# Patient Record
Sex: Female | Born: 1975
Health system: Southern US, Community
[De-identification: ages and names within clinical notes are randomized; demographics above are authoritative.]

## PROBLEM LIST (undated history)

## (undated) DIAGNOSIS — R112 Nausea with vomiting, unspecified: Secondary | ICD-10-CM

## (undated) DIAGNOSIS — E669 Obesity, unspecified: Secondary | ICD-10-CM

## (undated) DIAGNOSIS — Z9889 Other specified postprocedural states: Secondary | ICD-10-CM

## (undated) HISTORY — DX: Obesity, unspecified: E66.9

## (undated) HISTORY — PX: TONSILLECTOMY: SUR1361

## (undated) HISTORY — PX: TYMPANOSTOMY TUBE PLACEMENT: SHX32

---

## 2007-02-17 ENCOUNTER — Emergency Department (HOSPITAL_COMMUNITY): Admission: EM | Admit: 2007-02-17 | Discharge: 2007-02-17 | Payer: Self-pay | Admitting: Emergency Medicine

## 2008-07-03 ENCOUNTER — Encounter: Admission: RE | Admit: 2008-07-03 | Discharge: 2008-07-03 | Payer: Self-pay | Admitting: Obstetrics and Gynecology

## 2008-08-07 ENCOUNTER — Ambulatory Visit: Payer: Self-pay | Admitting: Obstetrics and Gynecology

## 2008-08-08 ENCOUNTER — Encounter (INDEPENDENT_AMBULATORY_CARE_PROVIDER_SITE_OTHER): Payer: Self-pay | Admitting: Obstetrics and Gynecology

## 2008-08-08 ENCOUNTER — Inpatient Hospital Stay (HOSPITAL_COMMUNITY): Admission: AD | Admit: 2008-08-08 | Discharge: 2008-08-13 | Payer: Self-pay | Admitting: Obstetrics and Gynecology

## 2008-08-29 ENCOUNTER — Ambulatory Visit: Admission: RE | Admit: 2008-08-29 | Discharge: 2008-08-29 | Payer: Self-pay | Admitting: Obstetrics and Gynecology

## 2008-09-10 ENCOUNTER — Ambulatory Visit: Admission: RE | Admit: 2008-09-10 | Discharge: 2008-09-10 | Payer: Self-pay | Admitting: Obstetrics and Gynecology

## 2010-12-21 ENCOUNTER — Encounter: Payer: Self-pay | Admitting: Obstetrics and Gynecology

## 2011-04-07 ENCOUNTER — Other Ambulatory Visit: Payer: Self-pay | Admitting: Obstetrics and Gynecology

## 2011-04-13 NOTE — Op Note (Signed)
NAMEJILLAINE, Morgan Hansen             ACCOUNT NO.:  [id number]   MEDICAL RECORD NO.:  [id number]          PATIENT TYPE:  INP   LOCATION:  9372                          FACILITY:  [facility]   PHYSICIAN:  Randye Lobo, M.D.   DATE OF BIRTH:  Jun 07, 1976   DATE OF PROCEDURE:  08/08/2008  DATE OF DISCHARGE:                               OPERATIVE REPORT   PREOPERATIVE DIAGNOSES:  1. Intrauterine gestation at 34 plus 6 weeks.  2. HELLP syndrome with severe thrombocytopenia.  3. A twin gestation with discordant growth and baby B with      intrauterine growth restriction.  4. Gestational diabetes mellitus, diet controlled.   POSTOPERATIVE DIAGNOSES:  1. Intrauterine gestation at 34 plus 6 weeks.  2. HELLP syndrome with severe thrombocytopenia.  3. A twin gestation with discordant growth and baby B with      intrauterine growth restriction.  4. Gestational diabetes mellitus, diet controlled.   PROCEDURE:  Primary low segment transverse cesarean section.   SURGEON:  Conley Simmonds, MD.   ASSISTANT:  Chinita [place], [place].   ANESTHESIA:  General.   IV FLUIDS:  1000 mL Ringer's lactate, transfusion of 1 unit of freezed  platelets (10-pack of platelets).   ESTIMATED BLOOD LOSS:  800 mL.   URINE OUTPUT:  150 mL.   COMPLICATIONS:  None.   DRAINS:  Jackson-Pratt drain in the subcutaneous layer.   INDICATIONS FOR PROCEDURE:  The patient is a 34 year old gravida 1, para  0, Caucasian female, status post in vitro fertilization with a known  twin diamniotic/dichorionic gestation at 48 weeks, who was sent from the  office for elevated blood pressure and 1+ proteinuria.  The patient's  antepartum course had been significant for gestational diabetes  mellitus, which was diet controlled and an ultrasound approximately 2  weeks prior which had shown a discordance of fetal growth of  approximately 15%.  The patient had fetal testing performed on August 07, 2008, at the [facility] and the  nonstress tests were reportedly  reactive.   On the day of admission today, the patient was reporting some headache  and some increased epigastric pain beyond her usual dyspepsia.  She  reported frequent urination of small amounts.   Upon admission, the patient's blood pressure was 147/89.  She had 2+  lower extremity edema.  The fetal heart rate tracings were reactive.  The patient had admission laboratory studies and a 24-hour urine  collection was begun.  The PIH panel was remarkable for a platelet count  of 57,000, which on recount was confirmed to be accurate.  The patient's  SGOT was 38.  On chart review, the patient was noted to have evidence of  thrombocytopenia at her 28-week labs with a platelet count of 120,000.   The patient at the time was diagnosed with possible HELLP syndrome with  significant thrombocytopenia.  A PIH panel was redrawn, and the patient  had an ultrasound simultaneously performed for evaluation of discordant  fetal growth.  Dr. Margot Ables from the Maternal Fetal Medicine Service was  asked to consult on the care of the patient.  The patient's platelet  count did return low at 46,000, and her liver function was noted to  began to rise.  The ultrasound documented baby A to be normally grown at  the [facility], but the baby B had growth measured at less than the  10th percentile.   A decision was made at this time to then proceed with an urgent cesarean  section based on the dropping platelet value.  The patient was typed and  crossed for 2 units of packed red blood cells and a platelet transfusion  of 1 unit of freezed platelets was ordered.  After planning with the  NICU, Anesthesia, and MSM team, the plan was made to proceed with a  cesarean section after risks and benefits were reviewed.   FINDINGS:  Baby A was a viable female delivered at 74 in the frank  breech presentation.  Apgars were 8 at 1 minute and 9 at 5 minutes.  The  amniotic fluid was  noted to be clear.  Baby B was a viable female  delivered at 1825 in the transverse presentation, which was converted to  frank breech.  Apgars again were 8 at 1 minute and 9 at 5 minutes.  The  amniotic fluid was also noted to be clear.  A cord pH was collected for  each of the newborns and were identified by the operating room staff a  separate samples, however, when the reached the laboratory, there were  not clearly identified and could therefore not be officially processed.  The placenta was marked with an umbilical clamp on cord A.  The uterus,  tubes, and ovaries were unremarkable.   SPECIMENS:  The placenta was sent to pathology.   PROCEDURE:  The patient was brought down to the operating room and she  was placed in the supine position with a left lateral tilt.  The abdomen  was sterilely prepped and a Foley catheter was sterilely placed inside  the bladder.  The patient was sterilely draped.   The patient did receive Ancef 1 g IV.  The 1 unit (10-pack of platelets)  was hung, and general anesthesia was induced by the anesthesia team.  A  Pfannenstiel incision was then created sharply with a scalpel and the  incision was carried down to the fascia using the scalpel.  The fascia  was then incised in the midline.  The fascia was extended with a Mayo  scissors.  The rectus muscles were dissected off the fascia superiorly  and inferiorly.  The rectus muscles were sharply divided in the midline.  The parietal peritoneum was elevated with 2 clamps and was entered  ultimately bluntly.  The peritoneal incision was extended cranially and  caudally.  The lower uterine segment was exposed.  The bladder flap was  sharply created.  A transverse lower uterine segment incision was  created with a scalpel.  Membranes were ruptured.  The uterine incision  was extended bluntly.  A hand was inserted through the uterine incision  and the baby A was delivered in the frank breech presentation.  The  legs  were delivered without difficulty.  The arms were swept across the chest  and the vertex was again delivered without difficulty.  The nares and  mouth were suctioned.  The umbilical cord was doubly clamped and cut,  and the newborn was carried over to the awaiting pediatricians in  vigorous condition.   The membranes were then ruptured for baby B.  The baby was converted  from a transverse to frank breech presentation.  Again, the buttocks and  the legs were delivered without difficulty.  The arms were swept across  the chest, and once again the vertex was delivered without delay and  without difficulty.  The nares and mouth were suctioned, and the  umbilical cord was doubly clamped and cut.  Baby B was then carried over  to the awaiting pediatricians in vigorous condition.   The umbilical cord A was marked with the umbilical clamp.  Cord blood  was collected from each of the umbilical cord and was set aside.  The  placenta was then manually extracted and was sent to pathology.   The uterus was exteriorized at this time.  It was wiped, cleaned with a  moistened lap pad.  The uterine incision was closed with a double layer  closure of #1 chromic.  The first was a running-locked layer and a  second was an imbricating layer.  The uterus was then returned to the  peritoneal cavity, which was irrigated and suctioned.  The uterine  incision was reexamined, and toward the midportion of the incision,  there were some oozing edges to the uterine incision.  A series of 3  figure-of-eight sutures of 2-0 chromic were placed and the peritoneal  edges along the uterine incision were cauterized with monopolar cautery.  This provided excellent hemostasis.   Prior to closure to the abdomen, the uterine incision was confirmed to  be hemostatic.   The parietal peritoneum was then closed with a running suture of 2-0  Vicryl.  The rectus muscles were reapproximated in the midline with   interrupted sutures of #1 chromic.  The fascia was closed with a running  suture of 0 Vicryl.  The subcutaneous layer was irrigated and suctioned  and was made hemostatic with monopolar cautery.  There was good  hemostasis at this time.  A Jackson-Pratt drain was then placed in the  subcutaneous layer and the drain was brought through the skin, an  incision was a lateral to the Pfannenstiel incision.  The skin was  closed with staples.  The JP drain was secured to the skin using silk  suture.  A sterile pressure bandage was placed over the incision.   The patient was awakened and extubated and escorted to the recovery in  stable and awake condition.  There were no complications to the  procedure.  All needle, instrument, sponge counts were correct.   The patient will be admitted to adult intensive care for postoperative  magnesium sulfate IV.      Randye Lobo, M.D.  Electronically Signed     BES/MEDQ  D:  08/08/2008  T:  08/09/2008  Job:  [id number]

## 2011-04-13 NOTE — Discharge Summary (Signed)
Morgan Hansen, Morgan Hansen             ACCOUNT NO.:  [id number]   MEDICAL RECORD NO.:  [id number]          PATIENT TYPE:  INP   LOCATION:  9319                          [facility]ACILITY:  [facility]   PHYSICIAN:  Ilda Mori, M.D.   DATE OF BIRTH:  1976-01-22   DATE OF ADMISSION:  08/08/2008  DATE OF DISCHARGE:  08/13/2008                               DISCHARGE SUMMARY   FINAL DIAGNOSES:  Intrauterine gestation at 34-6/7th weeks; hemolysis,  elevated liver enzymes, and low platelets syndrome with severe  thrombocytopenia; twin gestation with discordant growth.  Baby B with  intrauterine growth restriction, diet-controlled gestational diabetes  mellitus, and postoperative anemia.   PROCEDURE:  Primary low-segment transverse cesarean section.   SURGEON:  Randye Lobo, MD.   ASSISTANT:  Jose C. Silva, PA-C.   COMPLICATIONS:  None.   HISTORY OF PRESENT ILLNESS:  This is a 35 year old G1, P0 presents at 44-  6/7th weeks' gestation with a known twin Di-Di gestation.  The patient  is about 34-6/7th weeks when she presented to the office.  She has got  elevated blood pressure with 1+ protein in her urine.  The patient's  antepartum course has been complicated by gestational diabetes mellitus,  which has been diet controlled and an ultrasound 2 weeks prior that  showed discordant of the fetal growth.  The patient had been having  nonstress test at the [facility] that had been reactive.  The  patient was admitted at this time for continued monitoring.  She started  reporting some headaches, epigastric pain, and not urinating frequently.  She did have some edema.  Heart rate tracings were reactive, but a 24-  hour urine collection was begun, lab work and consult with Maternal  Fetal Medicine.  The patient's platelets came back at 57,000 and her 28  weeks' lab work showed her platelets of 120,000, so this is a  significant drop.  The patient was diagnosed with HELLP syndrome with  significant  thrombocytopenia.  The second set of blood work showed her  platelets dropped to 46,000 and liver function began to rise.  An  ultrasound documented baby A to 70th percentile while baby B measured  less than 10th percentile.  At this point, decision was made to proceed  with a cesarean section secondary to these drop in platelet count.  The  patient was typed and crossed and 2 units of packed red blood cells and  platelet transfusion occurred.  The patient had general anesthesia and  was taken to the operating room on August 08, 2008, by Dr. Conley Simmonds where a primary low-segment transverse cesarean section was  performed with the delivery of twin A.  I am just trying to get the  weight.  Twin A was a viable female on a frank breech presentation with  Apgars of 8 and 9.  Twin B was then delivered converted to a frank  breech and was also a female infant with Apgars of 8 and 9.  If I will get  the weights later, I will put them back in the dictation.  Cord pH were  obtained.  The patient was on magnesium sulfate at this time and did  receive Ancef IV prior to the cesarean section.  The delivery went  without complications.  The patient was kept in the AICU on magnesium  sulfate.  The patient's liver function tests were improving by  postoperative day #1.  She did have a Jackson-Pratt drain in place.  Her  hemoglobin did drop to a 7.  During this postoperative course, the  patient was able to be weaned off from magnesium sulfate by  postoperative day #2.  Her blood pressures were returning to within  normal limits.  Pain was well controlled.  By postoperative day #3, the  patient was transferred to the Mother-Baby Unit.  Her hemoglobin never  went below 5, stayed around 7, which was stable.  By postoperative day  #5, the patient was felt ready for discharge.  Her liver function tests  were normal and all her labs were stabilized.  She was sent home on a  regular diet with decreased  activities, told to continue her prenatal  vitamins and iron supplement daily.  She was given Percocet #20 1-4  every 4 hours as needed for pain and told to continue to use ibuprofen  up to 600 mg every 6 hours as needed for pain.  She is to follow up in  our office in 1 week for a blood pressure and incision check.  Instructions and precautions were reviewed with the patient.   LABORATORY DATA ON DISCHARGE:  The patient's hemoglobin was stable with  normal LFTs.  Platelets were above 100,000.      [name], P.A.-C.      Ilda Mori, M.D.  Electronically Signed    MB/MEDQ  D:  09/09/2008  T:  09/10/2008  Job:  [id number]

## 2011-09-01 LAB — CBC
HCT: 20.3 — ABNORMAL LOW
HCT: 30 — ABNORMAL LOW
HCT: 34.3 — ABNORMAL LOW
Hemoglobin: 10.2 — ABNORMAL LOW
Hemoglobin: 11.6 — ABNORMAL LOW
Hemoglobin: 11.9 — ABNORMAL LOW
Hemoglobin: 7 — CL
Hemoglobin: 9.3 — ABNORMAL LOW
MCHC: 33.6
MCHC: 33.9
MCHC: 34
MCHC: 34.5
MCHC: 34.8
MCV: 93.2
MCV: 93.6
MCV: 93.6
MCV: 95.1
Platelets: 116 — ABNORMAL LOW
Platelets: 57 — ABNORMAL LOW
Platelets: ADEQUATE
RBC: 2.15 — ABNORMAL LOW
RBC: 2.17 — ABNORMAL LOW
RBC: 2.86 — ABNORMAL LOW
RBC: 3.21 — ABNORMAL LOW
RBC: 3.68 — ABNORMAL LOW
RBC: 3.76 — ABNORMAL LOW
RDW: 13.6
RDW: 13.9
WBC: 6.8
WBC: 6.8
WBC: 7.9
WBC: 8.8

## 2011-09-01 LAB — CROSSMATCH
ABO/RH(D): O POS
Antibody Screen: NEGATIVE

## 2011-09-01 LAB — COMPREHENSIVE METABOLIC PANEL
ALT: 23
ALT: 26
ALT: 32
ALT: 37 — ABNORMAL HIGH
ALT: 37 — ABNORMAL HIGH
AST: 25
AST: 38 — ABNORMAL HIGH
AST: 45 — ABNORMAL HIGH
AST: 48 — ABNORMAL HIGH
AST: 56 — ABNORMAL HIGH
Albumin: 1.8 — ABNORMAL LOW
Albumin: 1.8 — ABNORMAL LOW
Albumin: 1.8 — ABNORMAL LOW
Albumin: 1.9 — ABNORMAL LOW
Albumin: 2 — ABNORMAL LOW
Alkaline Phosphatase: 136 — ABNORMAL HIGH
Alkaline Phosphatase: 198 — ABNORMAL HIGH
Alkaline Phosphatase: 198 — ABNORMAL HIGH
BUN: 6
BUN: 7
BUN: 8
CO2: 20
CO2: 23
CO2: 23
CO2: 28
Calcium: 7 — ABNORMAL LOW
Calcium: 7.5 — ABNORMAL LOW
Calcium: 8 — ABNORMAL LOW
Calcium: 8 — ABNORMAL LOW
Calcium: 8.2 — ABNORMAL LOW
Calcium: 8.4
Chloride: 104
Chloride: 104
Chloride: 106
Chloride: 106
Chloride: 108
Creatinine, Ser: 0.61
Creatinine, Ser: 0.63
Creatinine, Ser: 0.63
Creatinine, Ser: 0.66
Creatinine, Ser: 0.74
GFR calc Af Amer: >60
GFR calc Af Amer: >60
GFR calc Af Amer: >60
GFR calc Af Amer: >60
GFR calc non Af Amer: >60
GFR calc non Af Amer: >60
GFR calc non Af Amer: >60
GFR calc non Af Amer: >60
Glucose, Bld: 100 — ABNORMAL HIGH
Glucose, Bld: 101 — ABNORMAL HIGH
Glucose, Bld: 129 — ABNORMAL HIGH
Glucose, Bld: 85
Potassium: 3.3 — ABNORMAL LOW
Potassium: 3.6
Potassium: 3.7
Potassium: 4.3
Sodium: 132 — ABNORMAL LOW
Sodium: 135
Sodium: 136
Sodium: 137
Sodium: 138
Sodium: 138
Total Bilirubin: 0.4
Total Bilirubin: 0.4
Total Bilirubin: 0.7
Total Bilirubin: 0.7
Total Protein: 4.2 — ABNORMAL LOW
Total Protein: 4.6 — ABNORMAL LOW
Total Protein: 5 — ABNORMAL LOW
Total Protein: 5 — ABNORMAL LOW

## 2011-09-01 LAB — MAGNESIUM
Magnesium: 3.8 — ABNORMAL HIGH
Magnesium: 5.1 — ABNORMAL HIGH

## 2011-09-01 LAB — APTT: aPTT: 31

## 2011-09-01 LAB — URIC ACID
Uric Acid, Serum: 5
Uric Acid, Serum: 5.3
Uric Acid, Serum: 5.3

## 2011-09-01 LAB — URINE CULTURE
Colony Count: 50000
Special Requests: NEGATIVE

## 2011-09-01 LAB — PROTIME-INR
INR: 1
Prothrombin Time: 12.8

## 2011-09-01 LAB — GLUCOSE, CAPILLARY: Glucose-Capillary: 96

## 2011-09-01 LAB — RPR: RPR Ser Ql: NONREACTIVE

## 2011-09-01 LAB — PREPARE PLATELET PHERESIS

## 2011-09-01 LAB — LACTATE DEHYDROGENASE
LDH: 208
LDH: 210
LDH: 275 — ABNORMAL HIGH
LDH: 277 — ABNORMAL HIGH

## 2012-03-01 LAB — OB RESULTS CONSOLE RPR: RPR: NONREACTIVE

## 2012-03-01 LAB — OB RESULTS CONSOLE HIV ANTIBODY (ROUTINE TESTING)
HIV: NONREACTIVE
HIV: NONREACTIVE

## 2012-03-01 LAB — OB RESULTS CONSOLE ABO/RH

## 2012-03-01 LAB — OB RESULTS CONSOLE RUBELLA ANTIBODY, IGM: Rubella: IMMUNE

## 2012-05-10 ENCOUNTER — Emergency Department (HOSPITAL_COMMUNITY)
Admission: EM | Admit: 2012-05-10 | Discharge: 2012-05-10 | Disposition: A | Discharge status: Home / Self Care | Payer: Self-pay | Source: Home / Self Care

## 2012-07-12 ENCOUNTER — Encounter: Payer: 59 | Attending: Obstetrics and Gynecology | Admitting: *Deleted

## 2012-07-12 VITALS — Ht 63.0 in | Wt 225.1 lb

## 2012-07-12 DIAGNOSIS — O99810 Abnormal glucose complicating pregnancy: Secondary | ICD-10-CM | POA: Insufficient documentation

## 2012-07-12 DIAGNOSIS — O24919 Unspecified diabetes mellitus in pregnancy, unspecified trimester: Secondary | ICD-10-CM

## 2012-07-12 DIAGNOSIS — Z713 Dietary counseling and surveillance: Secondary | ICD-10-CM | POA: Insufficient documentation

## 2012-07-13 ENCOUNTER — Encounter: Payer: Self-pay | Admitting: *Deleted

## 2012-07-13 NOTE — Progress Notes (Signed)
  Patient was seen on 07/12/12 for Gestational Diabetes self-management class at the [facility]. The following learning objectives were met by the patient during this course:   States the definition of Gestational Diabetes  States why dietary management is important in controlling blood glucose  Describes the effects each nutrient has on blood glucose levels  Demonstrates ability to create a balanced meal plan  Demonstrates carbohydrate counting   States when to check blood glucose levels  Demonstrates proper blood glucose monitoring techniques  States the effect of stress and exercise on blood glucose levels  States the importance of limiting caffeine and abstaining from alcohol and smoking  Blood glucose monitor given:  One Touch Ultra Mini Self Monitoring Kit Lot # [id number] X Exp: 4/14 Blood glucose reading: 81 mg/dl  Patient instructed to monitor glucose levels: FBS: 60 - <90 2 hour: <120  *Patient received handouts:  Nutrition Diabetes and Pregnancy  Carbohydrate Counting List  Patient will be seen for follow-up as needed.

## 2012-07-13 NOTE — Patient Instructions (Signed)
Goals:  Check glucose levels per MD as instructed  Follow Gestational Diabetes Diet as instructed  Call for follow-up as needed    

## 2012-09-15 ENCOUNTER — Other Ambulatory Visit: Payer: Self-pay | Admitting: Obstetrics and Gynecology

## 2012-09-15 ENCOUNTER — Encounter (HOSPITAL_COMMUNITY): Payer: Self-pay

## 2012-09-18 ENCOUNTER — Encounter (HOSPITAL_COMMUNITY)
Admission: RE | Admit: 2012-09-18 | Discharge: 2012-09-18 | Disposition: A | Discharge status: Home / Self Care | Payer: 59 | Source: Ambulatory Visit | Attending: Obstetrics and Gynecology | Admitting: Obstetrics and Gynecology

## 2012-09-18 ENCOUNTER — Encounter (HOSPITAL_COMMUNITY): Payer: Self-pay

## 2012-09-18 HISTORY — DX: Other specified postprocedural states: Z98.890

## 2012-09-18 HISTORY — DX: Nausea with vomiting, unspecified: R11.2

## 2012-09-18 LAB — CBC
MCH: 29.8 pg (ref 26.0–34.0)
MCHC: 32.4 g/dL (ref 30.0–36.0)
MCV: 91.8 fL (ref 78.0–100.0)
Platelets: 133 10*3/uL — ABNORMAL LOW (ref 150–400)
RDW: 14.2 % (ref 11.5–15.5)
WBC: 7.1 10*3/uL (ref 4.0–10.5)

## 2012-09-18 LAB — BASIC METABOLIC PANEL
BUN: 10 mg/dL (ref 6–23)
CO2: 22 mEq/L (ref 19–32)
Chloride: 102 mEq/L (ref 96–112)
Creatinine, Ser: 0.65 mg/dL (ref 0.50–1.10)
Potassium: 4 mEq/L (ref 3.5–5.1)

## 2012-09-18 NOTE — Patient Instructions (Addendum)
20 KAYHLA GARNEAU  09/18/2012   Your procedure is scheduled on:  09/19/12  Enter through the Main Entrance of [facility] at 600 AM.  Pick up the phone at the desk and dial [phone].   Call this number if you have problems the morning of surgery: [phone]   Remember:   Do not eat food:After Midnight.  Do not drink clear liquids: After Midnight.  Take these medicines the morning of surgery with A SIP OF WATER: NA   Do not wear jewelry, make-up or nail polish.  Do not wear lotions, powders, or perfumes. You may wear deodorant.  Do not shave 48 hours prior to surgery.  Do not bring valuables to the hospital.  Contacts, dentures or bridgework may not be worn into surgery.  Leave suitcase in the car. After surgery it may be brought to your room.  For patients admitted to the hospital, checkout time is 11:00 AM the day of discharge.   Patients discharged the day of surgery will not be allowed to drive home.  Name and phone number of your driver: NA  Special Instructions: Shower using CHG 2 nights before surgery and the night before surgery.  If you shower the day of surgery use CHG.  Use special wash - you have one bottle of CHG for all showers.  You should use approximately 1/3 of the bottle for each shower.   Please read over the following fact sheets that you were given: MRSA Information

## 2012-09-19 ENCOUNTER — Inpatient Hospital Stay (HOSPITAL_COMMUNITY)
Admission: AD | Admit: 2012-09-19 | Discharge: 2012-09-22 | DRG: 766 | Disposition: A | Discharge status: Home / Self Care | Payer: 59 | Source: Ambulatory Visit | Attending: Obstetrics and Gynecology | Admitting: Obstetrics and Gynecology

## 2012-09-19 ENCOUNTER — Encounter (HOSPITAL_COMMUNITY)
Admission: AD | Disposition: A | Discharge status: Home / Self Care | Payer: Self-pay | Source: Ambulatory Visit | Attending: Obstetrics and Gynecology

## 2012-09-19 ENCOUNTER — Encounter (HOSPITAL_COMMUNITY): Payer: Self-pay | Admitting: Anesthesiology

## 2012-09-19 ENCOUNTER — Inpatient Hospital Stay (HOSPITAL_COMMUNITY): Payer: 59 | Admitting: Anesthesiology

## 2012-09-19 ENCOUNTER — Encounter (HOSPITAL_COMMUNITY): Payer: Self-pay | Admitting: *Deleted

## 2012-09-19 DIAGNOSIS — O34219 Maternal care for unspecified type scar from previous cesarean delivery: Secondary | ICD-10-CM | POA: Diagnosis present

## 2012-09-19 DIAGNOSIS — O09529 Supervision of elderly multigravida, unspecified trimester: Secondary | ICD-10-CM | POA: Diagnosis present

## 2012-09-19 DIAGNOSIS — O99814 Abnormal glucose complicating childbirth: Secondary | ICD-10-CM | POA: Diagnosis present

## 2012-09-19 DIAGNOSIS — Z302 Encounter for sterilization: Secondary | ICD-10-CM

## 2012-09-19 DIAGNOSIS — Z01812 Encounter for preprocedural laboratory examination: Secondary | ICD-10-CM

## 2012-09-19 DIAGNOSIS — O480 Post-term pregnancy: Principal | ICD-10-CM | POA: Diagnosis present

## 2012-09-19 HISTORY — PX: TUBAL LIGATION: SHX77

## 2012-09-19 LAB — PREPARE RBC (CROSSMATCH)

## 2012-09-19 SURGERY — Surgical Case
Anesthesia: Spinal | Site: Abdomen | Wound class: Clean Contaminated

## 2012-09-19 MED ORDER — ONDANSETRON HCL 4 MG PO TABS: 4.0000 mg | ORAL_TABLET | ORAL | Status: DC | PRN

## 2012-09-19 MED ORDER — PROMETHAZINE HCL 25 MG/ML IJ SOLN: 6.2500 mg | INTRAMUSCULAR | Status: DC | PRN

## 2012-09-19 MED ORDER — LACTATED RINGERS IV SOLN
Freq: Once | INTRAVENOUS | Status: AC
  Administered 2012-09-19 (×3): via INTRAVENOUS

## 2012-09-19 MED ORDER — BUPIVACAINE IN DEXTROSE 0.75-8.25 % IT SOLN
INTRATHECAL | Status: DC | PRN
  Administered 2012-09-19: 1.4 mL via INTRATHECAL

## 2012-09-19 MED ORDER — ONDANSETRON HCL 4 MG/2ML IJ SOLN: 4.0000 mg | Freq: Three times a day (TID) | INTRAMUSCULAR | Status: DC | PRN

## 2012-09-19 MED ORDER — CEFAZOLIN SODIUM-DEXTROSE 2-3 GM-% IV SOLR
INTRAVENOUS | Status: AC
  Filled 2012-09-19: qty 50

## 2012-09-19 MED ORDER — MEPERIDINE HCL 25 MG/ML IJ SOLN: 6.2500 mg | INTRAMUSCULAR | Status: DC | PRN

## 2012-09-19 MED ORDER — NALOXONE HCL 0.4 MG/ML IJ SOLN: 0.4000 mg | INTRAMUSCULAR | Status: DC | PRN

## 2012-09-19 MED ORDER — OXYTOCIN 40 UNITS IN LACTATED RINGERS INFUSION - SIMPLE MED
INTRAVENOUS | Status: DC | PRN
  Administered 2012-09-19: 40 [IU] via INTRAVENOUS

## 2012-09-19 MED ORDER — KETOROLAC TROMETHAMINE 30 MG/ML IJ SOLN
30.0000 mg | Freq: Four times a day (QID) | INTRAMUSCULAR | Status: AC | PRN
  Administered 2012-09-19: 30 mg via INTRAVENOUS
  Filled 2012-09-19: qty 1

## 2012-09-19 MED ORDER — MENTHOL 3 MG MT LOZG: 1.0000 | LOZENGE | OROMUCOSAL | Status: DC | PRN

## 2012-09-19 MED ORDER — FLEET ENEMA 7-19 GM/118ML RE ENEM: 1.0000 | ENEMA | Freq: Every day | RECTAL | Status: DC | PRN

## 2012-09-19 MED ORDER — FERROUS SULFATE 325 (65 FE) MG PO TABS
325.0000 mg | ORAL_TABLET | Freq: Two times a day (BID) | ORAL | Status: DC
  Administered 2012-09-20 – 2012-09-22 (×5): 325 mg via ORAL
  Filled 2012-09-19 (×5): qty 1

## 2012-09-19 MED ORDER — MEASLES, MUMPS & RUBELLA VAC ~~LOC~~ INJ: 0.5000 mL | INJECTION | Freq: Once | SUBCUTANEOUS | Status: DC

## 2012-09-19 MED ORDER — SCOPOLAMINE 1 MG/3DAYS TD PT72
MEDICATED_PATCH | TRANSDERMAL | Status: AC
  Administered 2012-09-19: 1.5 mg via TRANSDERMAL
  Filled 2012-09-19: qty 1

## 2012-09-19 MED ORDER — TETANUS-DIPHTH-ACELL PERTUSSIS 5-2.5-18.5 LF-MCG/0.5 IM SUSP: 0.5000 mL | Freq: Once | INTRAMUSCULAR | Status: DC

## 2012-09-19 MED ORDER — CEFAZOLIN SODIUM-DEXTROSE 2-3 GM-% IV SOLR
2.0000 g | INTRAVENOUS | Status: AC
  Administered 2012-09-19: 2 g via INTRAVENOUS

## 2012-09-19 MED ORDER — DIPHENHYDRAMINE HCL 50 MG/ML IJ SOLN: 12.5000 mg | INTRAMUSCULAR | Status: DC | PRN

## 2012-09-19 MED ORDER — ZOLPIDEM TARTRATE 5 MG PO TABS: 5.0000 mg | ORAL_TABLET | Freq: Every evening | ORAL | Status: DC | PRN

## 2012-09-19 MED ORDER — LANOLIN HYDROUS EX OINT: 1.0000 "application " | TOPICAL_OINTMENT | CUTANEOUS | Status: DC | PRN

## 2012-09-19 MED ORDER — NALBUPHINE HCL 10 MG/ML IJ SOLN
5.0000 mg | INTRAMUSCULAR | Status: DC | PRN
  Filled 2012-09-19: qty 1

## 2012-09-19 MED ORDER — METHYLERGONOVINE MALEATE 0.2 MG/ML IJ SOLN: 0.2000 mg | INTRAMUSCULAR | Status: DC | PRN

## 2012-09-19 MED ORDER — IBUPROFEN 600 MG PO TABS
600.0000 mg | ORAL_TABLET | Freq: Four times a day (QID) | ORAL | Status: DC
  Administered 2012-09-20 (×4): 600 mg via ORAL
  Filled 2012-09-19 (×6): qty 1

## 2012-09-19 MED ORDER — KETOROLAC TROMETHAMINE 30 MG/ML IJ SOLN
INTRAMUSCULAR | Status: AC
  Administered 2012-09-19: 30 mg via INTRAMUSCULAR
  Filled 2012-09-19: qty 1

## 2012-09-19 MED ORDER — DIPHENHYDRAMINE HCL 25 MG PO CAPS: 25.0000 mg | ORAL_CAPSULE | Freq: Four times a day (QID) | ORAL | Status: DC | PRN

## 2012-09-19 MED ORDER — SIMETHICONE 80 MG PO CHEW
80.0000 mg | CHEWABLE_TABLET | Freq: Three times a day (TID) | ORAL | Status: DC
  Administered 2012-09-19 – 2012-09-22 (×9): 80 mg via ORAL

## 2012-09-19 MED ORDER — METHYLERGONOVINE MALEATE 0.2 MG PO TABS: 0.2000 mg | ORAL_TABLET | ORAL | Status: DC | PRN

## 2012-09-19 MED ORDER — DIPHENHYDRAMINE HCL 50 MG/ML IJ SOLN: 25.0000 mg | INTRAMUSCULAR | Status: DC | PRN

## 2012-09-19 MED ORDER — ONDANSETRON HCL 4 MG/2ML IJ SOLN
4.0000 mg | INTRAMUSCULAR | Status: DC | PRN
  Administered 2012-09-19: 4 mg via INTRAVENOUS
  Filled 2012-09-19: qty 2

## 2012-09-19 MED ORDER — DIBUCAINE 1 % RE OINT: 1.0000 "application " | TOPICAL_OINTMENT | RECTAL | Status: DC | PRN

## 2012-09-19 MED ORDER — MORPHINE SULFATE (PF) 0.5 MG/ML IJ SOLN
INTRAMUSCULAR | Status: DC | PRN
  Administered 2012-09-19: .15 mg via INTRATHECAL

## 2012-09-19 MED ORDER — SODIUM CHLORIDE 0.9 % IJ SOLN: 3.0000 mL | INTRAMUSCULAR | Status: DC | PRN

## 2012-09-19 MED ORDER — PANTOPRAZOLE SODIUM 40 MG PO TBEC
40.0000 mg | DELAYED_RELEASE_TABLET | Freq: Every day | ORAL | Status: DC
  Administered 2012-09-20: 40 mg via ORAL
  Filled 2012-09-19 (×4): qty 1

## 2012-09-19 MED ORDER — MIDAZOLAM HCL 2 MG/2ML IJ SOLN: 0.5000 mg | Freq: Once | INTRAMUSCULAR | Status: DC | PRN

## 2012-09-19 MED ORDER — METOCLOPRAMIDE HCL 5 MG/ML IJ SOLN
10.0000 mg | Freq: Three times a day (TID) | INTRAMUSCULAR | Status: DC | PRN
  Administered 2012-09-19: 10 mg via INTRAVENOUS
  Filled 2012-09-19 (×2): qty 2

## 2012-09-19 MED ORDER — EPHEDRINE SULFATE 50 MG/ML IJ SOLN
INTRAMUSCULAR | Status: DC | PRN
  Administered 2012-09-19 (×2): 10 mg via INTRAVENOUS

## 2012-09-19 MED ORDER — OXYTOCIN 40 UNITS IN LACTATED RINGERS INFUSION - SIMPLE MED: 62.5000 mL/h | INTRAVENOUS | Status: AC

## 2012-09-19 MED ORDER — ACETAMINOPHEN 10 MG/ML IV SOLN: 1000.0000 mg | Freq: Four times a day (QID) | INTRAVENOUS | Status: AC | PRN

## 2012-09-19 MED ORDER — EPHEDRINE 5 MG/ML INJ
INTRAVENOUS | Status: AC
  Filled 2012-09-19: qty 10

## 2012-09-19 MED ORDER — ONDANSETRON HCL 4 MG/2ML IJ SOLN
INTRAMUSCULAR | Status: AC
  Filled 2012-09-19: qty 2

## 2012-09-19 MED ORDER — DIPHENHYDRAMINE HCL 25 MG PO CAPS
25.0000 mg | ORAL_CAPSULE | ORAL | Status: DC | PRN
  Filled 2012-09-19: qty 1

## 2012-09-19 MED ORDER — LACTATED RINGERS IV SOLN
INTRAVENOUS | Status: DC
  Administered 2012-09-19: 15:00:00 via INTRAVENOUS

## 2012-09-19 MED ORDER — SENNOSIDES-DOCUSATE SODIUM 8.6-50 MG PO TABS
2.0000 | ORAL_TABLET | Freq: Every day | ORAL | Status: DC
  Administered 2012-09-19 – 2012-09-21 (×3): 2 via ORAL

## 2012-09-19 MED ORDER — KETOROLAC TROMETHAMINE 30 MG/ML IJ SOLN
30.0000 mg | Freq: Four times a day (QID) | INTRAMUSCULAR | Status: AC | PRN
  Administered 2012-09-19: 30 mg via INTRAMUSCULAR

## 2012-09-19 MED ORDER — LACTATED RINGERS IV SOLN
INTRAVENOUS | Status: DC | PRN
  Administered 2012-09-19: 08:00:00 via INTRAVENOUS

## 2012-09-19 MED ORDER — SCOPOLAMINE 1 MG/3DAYS TD PT72: 1.0000 | MEDICATED_PATCH | Freq: Once | TRANSDERMAL | Status: DC

## 2012-09-19 MED ORDER — SODIUM CHLORIDE 0.9 % IV SOLN: 1.0000 ug/kg/h | INTRAVENOUS | Status: DC | PRN

## 2012-09-19 MED ORDER — FENTANYL CITRATE 0.05 MG/ML IJ SOLN
INTRAMUSCULAR | Status: AC
  Filled 2012-09-19: qty 2

## 2012-09-19 MED ORDER — ONDANSETRON HCL 4 MG/2ML IJ SOLN
INTRAMUSCULAR | Status: DC | PRN
  Administered 2012-09-19: 4 mg via INTRAVENOUS

## 2012-09-19 MED ORDER — MORPHINE SULFATE 0.5 MG/ML IJ SOLN
INTRAMUSCULAR | Status: AC
  Filled 2012-09-19: qty 10

## 2012-09-19 MED ORDER — WITCH HAZEL-GLYCERIN EX PADS: 1.0000 "application " | MEDICATED_PAD | CUTANEOUS | Status: DC | PRN

## 2012-09-19 MED ORDER — SIMETHICONE 80 MG PO CHEW: 80.0000 mg | CHEWABLE_TABLET | ORAL | Status: DC | PRN

## 2012-09-19 MED ORDER — SCOPOLAMINE 1 MG/3DAYS TD PT72
1.0000 | MEDICATED_PATCH | Freq: Once | TRANSDERMAL | Status: DC
  Administered 2012-09-19: 1.5 mg via TRANSDERMAL

## 2012-09-19 MED ORDER — BISACODYL 10 MG RE SUPP: 10.0000 mg | Freq: Every day | RECTAL | Status: DC | PRN

## 2012-09-19 MED ORDER — PHENYLEPHRINE 40 MCG/ML (10ML) SYRINGE FOR IV PUSH (FOR BLOOD PRESSURE SUPPORT)
PREFILLED_SYRINGE | INTRAVENOUS | Status: AC
  Filled 2012-09-19: qty 5

## 2012-09-19 MED ORDER — OXYCODONE-ACETAMINOPHEN 5-325 MG PO TABS
1.0000 | ORAL_TABLET | ORAL | Status: DC | PRN
  Administered 2012-09-20: 1 via ORAL
  Administered 2012-09-20: 2 via ORAL
  Administered 2012-09-20 (×2): 1 via ORAL
  Administered 2012-09-21 – 2012-09-22 (×7): 2 via ORAL
  Filled 2012-09-19: qty 1
  Filled 2012-09-19 (×6): qty 2
  Filled 2012-09-19: qty 1
  Filled 2012-09-19 (×2): qty 2
  Filled 2012-09-19: qty 1

## 2012-09-19 MED ORDER — OMEPRAZOLE MAGNESIUM 20 MG PO TBEC: 20.0000 mg | DELAYED_RELEASE_TABLET | Freq: Every morning | ORAL | Status: DC

## 2012-09-19 MED ORDER — FENTANYL CITRATE 0.05 MG/ML IJ SOLN: 25.0000 ug | INTRAMUSCULAR | Status: DC | PRN

## 2012-09-19 MED ORDER — OXYTOCIN 10 UNIT/ML IJ SOLN
INTRAMUSCULAR | Status: AC
  Filled 2012-09-19: qty 4

## 2012-09-19 MED ORDER — PRENATAL MULTIVITAMIN CH
1.0000 | ORAL_TABLET | Freq: Every day | ORAL | Status: DC
  Administered 2012-09-20 – 2012-09-22 (×3): 1 via ORAL
  Filled 2012-09-19 (×3): qty 1

## 2012-09-19 MED ORDER — FENTANYL CITRATE 0.05 MG/ML IJ SOLN
INTRAMUSCULAR | Status: DC | PRN
  Administered 2012-09-19: 25 ug via INTRATHECAL

## 2012-09-19 SURGICAL SUPPLY — 31 items
CLIP FILSHIE TUBAL LIGA STRL (Clip) ×5 IMPLANT
CLOTH BEACON ORANGE TIMEOUT ST (SAFETY) ×3 IMPLANT
DRAPE SURG 17X23 STRL (DRAPES) ×3 IMPLANT
DRSG COVADERM 4X10 (GAUZE/BANDAGES/DRESSINGS) ×1 IMPLANT
DURAPREP 26ML APPLICATOR (WOUND CARE) ×3 IMPLANT
ELECT REM PT RETURN 9FT ADLT (ELECTROSURGICAL) ×3
ELECTRODE REM PT RTRN 9FT ADLT (ELECTROSURGICAL) ×2 IMPLANT
EXTRACTOR VACUUM BELL STYLE (SUCTIONS) IMPLANT
GLOVE BIO SURGEON STRL SZ7 (GLOVE) ×6 IMPLANT
GOWN PREVENTION PLUS LG XLONG (DISPOSABLE) ×9 IMPLANT
KIT ABG SYR 3ML LUER SLIP (SYRINGE) IMPLANT
NDL HYPO 25X5/8 SAFETYGLIDE (NEEDLE) IMPLANT
NEEDLE HYPO 25X5/8 SAFETYGLIDE (NEEDLE) IMPLANT
NS IRRIG 1000ML POUR BTL (IV SOLUTION) ×3 IMPLANT
PACK C SECTION WH (CUSTOM PROCEDURE TRAY) ×3 IMPLANT
PAD OB MATERNITY 4.3X12.25 (PERSONAL CARE ITEMS) IMPLANT
RETRACTOR WND ALEXIS 25 LRG (MISCELLANEOUS) ×2 IMPLANT
RTRCTR WOUND ALEXIS 25CM LRG (MISCELLANEOUS) ×3
SLEEVE SCD COMPRESS KNEE MED (MISCELLANEOUS) IMPLANT
STAPLER VISISTAT 35W (STAPLE) IMPLANT
SUT MNCRL 0 VIOLET CTX 36 (SUTURE) ×4 IMPLANT
SUT MONOCRYL 0 CTX 36 (SUTURE) ×2
SUT PDS AB 0 CTX 60 (SUTURE) IMPLANT
SUT PLAIN 2 0 XLH (SUTURE) IMPLANT
SUT VIC AB 0 CT1 27 (SUTURE) ×6
SUT VIC AB 0 CT1 27XBRD ANBCTR (SUTURE) ×4 IMPLANT
SUT VIC AB 2-0 CT1 27 (SUTURE) ×3
SUT VIC AB 2-0 CT1 TAPERPNT 27 (SUTURE) ×2 IMPLANT
TOWEL OR 17X24 6PK STRL BLUE (TOWEL DISPOSABLE) ×6 IMPLANT
TRAY FOLEY CATH 14FR (SET/KITS/TRAYS/PACK) ×3 IMPLANT
WATER STERILE IRR 1000ML POUR (IV SOLUTION) ×3 IMPLANT

## 2012-09-19 NOTE — Addendum Note (Signed)
Addendum  created 09/19/12 1640 by Graciela Husbands, CRNA   Modules edited:Notes Section

## 2012-09-19 NOTE — Progress Notes (Signed)
UR Chart review completed.  

## 2012-09-19 NOTE — Brief Op Note (Signed)
09/19/2012  8:39 AM  PATIENT:  Morgan Hansen  36 y.o. female  PRE-OPERATIVE DIAGNOSIS:  REPEAT  POST-OPERATIVE DIAGNOSIS:  repeat  PROCEDURE:  Procedure(s) (LRB) with comments: CESAREAN SECTION (N/A) BILATERAL TUBAL LIGATION (Bilateral)  SURGEON:  Surgeon(s) and Role:    * Loney Laurence, MD - Primary  ASSISTANTS: none   ANESTHESIA:   spinal  EBL:  Total I/O In: 3500 [I.V.:3500] Out: 1100 [Urine:500; Blood:600]  SPECIMEN:  Source of Specimen:  placenta  DISPOSITION OF SPECIMEN:  PATHOLOGY  COUNTS:  YES  TOURNIQUET:  * No tourniquets in log *  DICTATION: .Note written in EPIC  PLAN OF CARE: Admit to inpatient   PATIENT DISPOSITION:  PACU - hemodynamically stable.   Delay start of Pharmacological VTE agent (>24hrs) due to surgical blood loss or risk of bleeding: not applicable  Complications:  None.    Medications:  Ancef, Pitocin Findings:  Baby female, Apgars 9,10, weight P.   Normal tubes, ovaries and uterus seen.  Technique:  After adequate spinal anesthesia was achieved, the patient was prepped and draped in usual sterile fashion.  A foley catheter was used to drain the bladder.  A pfannanstiel incision was made with the scalpel and carried down to the fascia with the bovie cautery. The fascia was incised in the midline with the scalpel and carried in a transverse curvilinear manner bilaterally.  The fascia was reflected superiorly and inferiorly off the rectus muscles and the muscles split in the midline.  A bowel free portion of the peritoneum was entered bluntly and then extended in a superior and inferior manner with good visualization of the bowel and bladder.  The Alexis instrument was then placed and the vesico-uterine fascia tented up and incised in a transverse curvilinear manner.  A 2 cm transverse incision was made in the upper portion of the lower uterine segment until the amnion was exposed.   The incision was extended transversely in a blunt  manner.  Clear fluid was noted and the baby delivered in the vertex presentation without complication.  The baby was bulb suctioned and the cord was clamped and cut.  The baby was then handed to awaiting Neonatology.  The placenta was then delivered manually and the uterus cleared of all debris.  The uterine incision was then closed with a running lock stitch of 0 monocryl.  An imbricating layer of 0 monocryl was closed as well. Good hemostasis of the uterine incision was achieved and the abdomen was cleared with irrigation.    Prior to the surgery the patient was counseled that the BTL procedure was permanent and all risks, benefits, and alternatives had been discussed.  Each tube in succession was tented up and followed to it's fimbriated end.  One filschie clip was placed on the isthmic portion of each tube.  The clips were confirmed to occlude the entire circumference of each tube.   The peritoneum was closed with a running stitch of 2-0 vicryl.  This incorporated the rectus muscles as a separate layer.  The fascia was then closed with a running stitch of 0 vicryl.  The subcutaneous layer was closed with interrupted  stitches of 3-0 plain gut.  The skin was closed with staples.  The patient tolerated the procedure well and was returned to the recovery room in stable condition.  All counts were correct times three.  Mehak Roskelley A

## 2012-09-19 NOTE — Anesthesia Postprocedure Evaluation (Signed)
  Anesthesia Post-op Note  Patient: Morgan Hansen  Procedure(s) Performed: Procedure(s) (LRB) with comments: CESAREAN SECTION (N/A) BILATERAL TUBAL LIGATION (Bilateral)  Patient Location: Mother/Baby  Anesthesia Type: Spinal  Level of Consciousness: awake, alert  and oriented  Airway and Oxygen Therapy: Patient Spontanous Breathing  Post-op Pain: mild  Post-op Assessment: Post-op Vital signs reviewed, Patient's Cardiovascular Status Stable, Pain level controlled, No headache, No backache, No residual numbness and No residual motor weakness  Post-op Vital Signs: Reviewed and stable  Complications: No apparent anesthesia complications

## 2012-09-19 NOTE — Anesthesia Preprocedure Evaluation (Signed)
Anesthesia Evaluation  Patient identified by MRN, date of birth, ID band Patient awake    Reviewed: Allergy & Precautions, H&P , NPO status , Patient's Chart, lab work & pertinent test results  History of Anesthesia Complications (+) PONV  Airway Mallampati: III      Dental No notable dental hx.    Pulmonary neg pulmonary ROS,  breath sounds clear to auscultation  Pulmonary exam normal       Cardiovascular Exercise Tolerance: Good negative cardio ROS  Rhythm:regular Rate:Normal     Neuro/Psych negative neurological ROS  negative psych ROS   GI/Hepatic negative GI ROS, Neg liver ROS,   Endo/Other  negative endocrine ROSdiabetes, GestationalMorbid obesity  Renal/GU negative Renal ROS  negative genitourinary   Musculoskeletal   Abdominal Normal abdominal exam  (+)   Peds  Hematology negative hematology ROS (+)   Anesthesia Other Findings   Reproductive/Obstetrics (+) Pregnancy                           Anesthesia Physical Anesthesia Plan  ASA: III  Anesthesia Plan: Spinal   Post-op Pain Management:    Induction:   Airway Management Planned:   Additional Equipment:   Intra-op Plan:   Post-operative Plan:   Informed Consent: I have reviewed the patients History and Physical, chart, labs and discussed the procedure including the risks, benefits and alternatives for the proposed anesthesia with the patient or authorized representative who has indicated his/her understanding and acceptance.     Plan Discussed with: Anesthesiologist, CRNA and Surgeon  Anesthesia Plan Comments:         Anesthesia Quick Evaluation

## 2012-09-19 NOTE — Transfer of Care (Signed)
Immediate Anesthesia Transfer of Care Note  Patient: Morgan Hansen  Procedure(s) Performed: Procedure(s) (LRB) with comments: CESAREAN SECTION (N/A) BILATERAL TUBAL LIGATION (Bilateral)  Patient Location: PACU  Anesthesia Type: Spinal  Level of Consciousness: awake, alert  and oriented  Airway & Oxygen Therapy: Patient Spontanous Breathing  Post-op Assessment: Report given to PACU RN and Post -op Vital signs reviewed and stable  Post vital signs: Reviewed and stable  Complications: No apparent anesthesia complications

## 2012-09-19 NOTE — Anesthesia Postprocedure Evaluation (Signed)
  Anesthesia Post-op Note  Patient: Morgan Hansen  Procedure(s) Performed: Procedure(s) (LRB) with comments: CESAREAN SECTION (N/A) BILATERAL TUBAL LIGATION (Bilateral)  Patient Location: PACU  Anesthesia Type: Spinal  Level of Consciousness: awake, alert  and oriented  Airway and Oxygen Therapy: Patient Spontanous Breathing  Post-op Pain: none  Post-op Assessment: Post-op Vital signs reviewed, Patient's Cardiovascular Status Stable, Respiratory Function Stable, Patent Airway, No signs of Nausea or vomiting, Pain level controlled, No headache, No backache and No residual numbness  Post-op Vital Signs: Reviewed and stable  Complications: No apparent anesthesia complications

## 2012-09-19 NOTE — Op Note (Signed)
09/19/2012  8:39 AM  PATIENT:  Morgan Hansen  36 y.o. female  PRE-OPERATIVE DIAGNOSIS:  REPEAT  POST-OPERATIVE DIAGNOSIS:  repeat  PROCEDURE:  Procedure(s) (LRB) with comments: CESAREAN SECTION (N/A) BILATERAL TUBAL LIGATION (Bilateral)  SURGEON:  Surgeon(s) and Role:    * Emmilia Sowder A Anthonio Mizzell, MD - Primary  ASSISTANTS: none   ANESTHESIA:   spinal  EBL:  Total I/O In: 3500 [I.V.:3500] Out: 1100 [Urine:500; Blood:600]  SPECIMEN:  Source of Specimen:  placenta  DISPOSITION OF SPECIMEN:  PATHOLOGY  COUNTS:  YES  TOURNIQUET:  * No tourniquets in log *  DICTATION: .Note written in EPIC  PLAN OF CARE: Admit to inpatient   PATIENT DISPOSITION:  PACU - hemodynamically stable.   Delay start of Pharmacological VTE agent (>24hrs) due to surgical blood loss or risk of bleeding: not applicable  Complications:  None.    Medications:  Ancef, Pitocin Findings:  Baby female, Apgars 9,10, weight P.   Normal tubes, ovaries and uterus seen.  Technique:  After adequate spinal anesthesia was achieved, the patient was prepped and draped in usual sterile fashion.  A foley catheter was used to drain the bladder.  A pfannanstiel incision was made with the scalpel and carried down to the fascia with the bovie cautery. The fascia was incised in the midline with the scalpel and carried in a transverse curvilinear manner bilaterally.  The fascia was reflected superiorly and inferiorly off the rectus muscles and the muscles split in the midline.  A bowel free portion of the peritoneum was entered bluntly and then extended in a superior and inferior manner with good visualization of the bowel and bladder.  The Alexis instrument was then placed and the vesico-uterine fascia tented up and incised in a transverse curvilinear manner.  A 2 cm transverse incision was made in the upper portion of the lower uterine segment until the amnion was exposed.   The incision was extended transversely in a blunt  manner.  Clear fluid was noted and the baby delivered in the vertex presentation without complication.  The baby was bulb suctioned and the cord was clamped and cut.  The baby was then handed to awaiting Neonatology.  The placenta was then delivered manually and the uterus cleared of all debris.  The uterine incision was then closed with a running lock stitch of 0 monocryl.  An imbricating layer of 0 monocryl was closed as well. Good hemostasis of the uterine incision was achieved and the abdomen was cleared with irrigation.    Prior to the surgery the patient was counseled that the BTL procedure was permanent and all risks, benefits, and alternatives had been discussed.  Each tube in succession was tented up and followed to it's fimbriated end.  One filschie clip was placed on the isthmic portion of each tube.  The clips were confirmed to occlude the entire circumference of each tube.   The peritoneum was closed with a running stitch of 2-0 vicryl.  This incorporated the rectus muscles as a separate layer.  The fascia was then closed with a running stitch of 0 vicryl.  The subcutaneous layer was closed with interrupted  stitches of 3-0 plain gut.  The skin was closed with staples.  The patient tolerated the procedure well and was returned to the recovery room in stable condition.  All counts were correct times three.  Roann Merk A    

## 2012-09-19 NOTE — H&P (Signed)
36 y.o.  [redacted]w[redacted]d    G1P0 comes in for a repeat cesarean section at term.  Patient has good fetal movement and no bleeding.  She also desires permanent sterilization.    Past Medical History  Diagnosis Date  . Obesity   . PONV (postoperative nausea and vomiting)   . Diabetes mellitus     GDM diet controlled    Past Surgical History  Procedure Date  . Cesarean section   . Tonsillectomy   . Tympanostomy tube placement     OB History    Grav Para Term Preterm Abortions TAB SAB Ect Mult Living   1        1 2      # Outc Date GA Lbr Len/2nd Wgt Sex Del Anes PTL Lv   1 CUR               History   Social History  . Marital Status: Married    Spouse Name: N/A    Number of Children: N/A  . Years of Education: N/A   Occupational History  . Not on file.   Social History Main Topics  . Smoking status: Never Smoker   . Smokeless tobacco: Not on file  . Alcohol Use: No  . Drug Use: No  . Sexually Active: Not on file   Other Topics Concern  . Not on file   Social History Narrative  . No narrative on file   Shellfish allergy   Prenatal Course: Uncomplicated A1GDM- sugars all normal by diet alone.  AMA- first trimester screen was normal and normal anatomy.  Filed Vitals:   09/19/12 0559  BP: 110/82  Pulse: 68  Temp: 97.9 F (36.6 C)  Resp: 18     Lungs/Cor:  NAD Abdomen:  soft, gravid Ex:  no cords, erythema SVE:  NA FHTs:  present  A/P   For repeat cesarean sectionat term with BTL.  All risks, benefits and alternatives discussed with patient and she desires to proceed.  Melesa Lecy A

## 2012-09-19 NOTE — Anesthesia Procedure Notes (Signed)
Spinal  Patient location during procedure: OR Start time: 09/19/2012 7:55 AM Staffing Anesthesiologist: Mescal Flinchbaugh A. Performed by: anesthesiologist  Preanesthetic Checklist Completed: patient identified, site marked, surgical consent, pre-op evaluation, timeout performed, IV checked, risks and benefits discussed and monitors and equipment checked Spinal Block Patient position: sitting Prep: site prepped and draped and DuraPrep Patient monitoring: heart rate, cardiac monitor, continuous pulse ox and blood pressure Approach: midline Location: L3-4 Injection technique: single-shot Needle Needle type: Sprotte  Needle gauge: 24 G Needle length: 9 cm Needle insertion depth: 5 cm Assessment Sensory level: T4 Additional Notes Patient tolerated procedure well. Adequate sensory level.

## 2012-09-20 ENCOUNTER — Encounter (HOSPITAL_COMMUNITY): Payer: Self-pay | Admitting: Obstetrics and Gynecology

## 2012-09-20 LAB — CBC
HCT: 30.5 % — ABNORMAL LOW (ref 36.0–46.0)
Hemoglobin: 10 g/dL — ABNORMAL LOW (ref 12.0–15.0)
MCV: 92.4 fL (ref 78.0–100.0)
RBC: 3.3 MIL/uL — ABNORMAL LOW (ref 3.87–5.11)
WBC: 5.7 10*3/uL (ref 4.0–10.5)

## 2012-09-20 NOTE — Progress Notes (Signed)
Post Op Day 1 Subjective: no complaints  Objective: Blood pressure 105/61, pulse 62, temperature 98 F (36.7 C), temperature source Axillary, resp. rate 18, height 5\' 3"  (1.6 m), weight 216 lb (97.977 kg), SpO2 95.00%, unknown if currently breastfeeding.  Physical Exam:  General: alert Lochia: appropriate Uterine Fundus: firm Incision: no significant drainage   Basename 09/20/12 0540 09/18/12 1500  HGB 10.0* 12.0  HCT 30.5* 37.0    Assessment/Plan: Doing well.  Discussed discharge tomorrow if she desires.   LOS: 1 day   Teodoro Jeffreys D 09/20/2012, 9:06 AM

## 2012-09-21 LAB — TYPE AND SCREEN

## 2012-09-21 MED ORDER — OXYCODONE-ACETAMINOPHEN 5-325 MG PO TABS: 1.0000 | ORAL_TABLET | ORAL | Status: DC | PRN

## 2012-09-21 NOTE — Progress Notes (Signed)
  Patient is eating, ambulating, voiding.  Pain control is good.  Filed Vitals:   09/20/12 0752 09/20/12 1411 09/20/12 2141 09/21/12 0601  BP: 105/61 110/74 110/70 116/71  Pulse: 62 76 62 91  Temp: 98 F (36.7 C) 98 F (36.7 C) 97.5 F (36.4 C) 97.7 F (36.5 C)  TempSrc: Axillary Oral Oral Oral  Resp: 18 18 18 18   Height:      Weight:      SpO2: 95%       lungs:   clear to auscultation cor:    RRR Abdomen:  soft, appropriate tenderness, incisions intact and without erythema or exudate ex:    no cords   Lab Results  Component Value Date   WBC 5.7 09/20/2012   HGB 10.0* 09/20/2012   HCT 30.5* 09/20/2012   MCV 92.4 09/20/2012   PLT 89* 09/20/2012    --/--/O POS (10/21 1500)/RI  A/P    Post operative day 2.  Routine post op and postpartum care.  Expect d/c today.  Percocet for pain control.

## 2012-09-21 NOTE — Discharge Summary (Signed)
Obstetric Discharge Summary Reason for Admission: cesarean section Prenatal Procedures: none Intrapartum Procedures: cesarean section, BTL Postpartum Procedures: none Complications-Operative and Postpartum: none Hemoglobin  Date Value Range Status  09/20/2012 10.0* 12.0 - 15.0 g/dL Final     HCT  Date Value Range Status  09/20/2012 30.5* 36.0 - 46.0 % Final   Hospital Course: Pt admitted for repeat C/S at term (post due date) secondary no labor for TOL.  Underwent uncomplicated repeat C/S and BTL.  Postoperative course was normal and pt was d/ced on POD 2.  Discharge Diagnoses: Term Pregnancy-delivered  Discharge Information: Date: 09/21/2012 Activity: pelvic rest Diet: routine Medications: Percocet Condition: stable Instructions: refer to practice specific booklet Discharge to: home Follow-up Information    Follow up with Kirby Argueta A, MD. Schedule an appointment as soon as possible for a visit in 2 weeks. (come to have staples out tomorrow)    Contact information:   [address] [place] [address] [phone]          Newborn Data: Live born female  Birth Weight: 6 lb 11.4 oz (3045 g) APGAR: 9, 10  Home with mother.  Morgan Hansen A 09/21/2012, 8:23 AM

## 2012-09-22 MED ORDER — OXYCODONE-ACETAMINOPHEN 5-325 MG PO TABS: 1.0000 | ORAL_TABLET | ORAL | Status: DC | PRN

## 2012-09-25 NOTE — Progress Notes (Signed)
Post discharge chart review completed.  

## 2014-02-22 ENCOUNTER — Other Ambulatory Visit: Payer: Self-pay | Admitting: Obstetrics and Gynecology

## 2014-04-05 ENCOUNTER — Emergency Department (INDEPENDENT_AMBULATORY_CARE_PROVIDER_SITE_OTHER): Payer: 59

## 2014-04-05 ENCOUNTER — Emergency Department (HOSPITAL_COMMUNITY)
Admission: EM | Admit: 2014-04-05 | Discharge: 2014-04-05 | Disposition: A | Discharge status: Home / Self Care | Payer: 59 | Source: Home / Self Care | Attending: Emergency Medicine | Admitting: Emergency Medicine

## 2014-04-05 ENCOUNTER — Encounter (HOSPITAL_COMMUNITY): Payer: Self-pay | Admitting: Emergency Medicine

## 2014-04-05 DIAGNOSIS — S52599A Other fractures of lower end of unspecified radius, initial encounter for closed fracture: Secondary | ICD-10-CM

## 2014-04-05 DIAGNOSIS — W19XXXA Unspecified fall, initial encounter: Secondary | ICD-10-CM

## 2014-04-05 DIAGNOSIS — S52509A Unspecified fracture of the lower end of unspecified radius, initial encounter for closed fracture: Secondary | ICD-10-CM

## 2014-04-05 DIAGNOSIS — Y93E9 Activity, other interior property and clothing maintenance: Secondary | ICD-10-CM

## 2014-04-05 NOTE — ED Notes (Signed)
Pt c/o left arm inj onset 1430 Reports she slipped on a toy and fell on right arm/forearm Pain increases w/activity; 5/10 Sx also include swelling Alert w/no signs of acute distress.

## 2014-04-05 NOTE — Discharge Instructions (Signed)
Cast or Splint Care Casts and splints support injured limbs and keep bones from moving while they heal. It is important to care for your cast or splint at home.  HOME CARE INSTRUCTIONS  Keep the cast or splint uncovered during the drying period. It can take 24 to 48 hours to dry if it is made of plaster. A fiberglass cast will dry in less than 1 hour.  Do not rest the cast on anything harder than a pillow for the first 24 hours.  Do not put weight on your injured limb or apply pressure to the cast until your health care provider gives you permission.  Keep the cast or splint dry. Wet casts or splints can lose their shape and may not support the limb as well. A wet cast that has lost its shape can also create harmful pressure on your skin when it dries. Also, wet skin can become infected.  Cover the cast or splint with a plastic bag when bathing or when out in the rain or snow. If the cast is on the trunk of the body, take sponge baths until the cast is removed.  If your cast does become wet, dry it with a towel or a blow dryer on the cool setting only.  Keep your cast or splint clean. Soiled casts may be wiped with a moistened cloth.  Do not place any hard or soft foreign objects under your cast or splint, such as cotton, toilet paper, lotion, or powder.  Do not try to scratch the skin under the cast with any object. The object could get stuck inside the cast. Also, scratching could lead to an infection. If itching is a problem, use a blow dryer on a cool setting to relieve discomfort.  Do not trim or cut your cast or remove padding from inside of it.  Exercise all joints next to the injury that are not immobilized by the cast or splint. For example, if you have a long leg cast, exercise the hip joint and toes. If you have an arm cast or splint, exercise the shoulder, elbow, thumb, and fingers.  Elevate your injured arm or leg on 1 or 2 pillows for the first 1 to 3 days to decrease  swelling and pain.It is best if you can comfortably elevate your cast so it is higher than your heart. SEEK MEDICAL CARE IF:   Your cast or splint cracks.  Your cast or splint is too tight or too loose.  You have unbearable itching inside the cast.  Your cast becomes wet or develops a soft spot or area.  You have a bad smell coming from inside your cast.  You get an object stuck under your cast.  Your skin around the cast becomes red or raw.  You have new pain or worsening pain after the cast has been applied. SEEK IMMEDIATE MEDICAL CARE IF:   You have fluid leaking through the cast.  You are unable to move your fingers or toes.  You have discolored (blue or white), cool, painful, or very swollen fingers or toes beyond the cast.  You have tingling or numbness around the injured area.  You have severe pain or pressure under the cast.  You have any difficulty with your breathing or have shortness of breath.  You have chest pain. Document Released: 11/12/2000 Document Revised: 09/05/2013 Document Reviewed: 05/24/2013 [facility] Patient Information 2014 [place].  Radial Fracture You have a broken bone (fracture) of the forearm. This is the  part of your arm between the elbow and your wrist. Your forearm is made up of two bones. These are the radius and ulna. Your fracture is in the radial shaft. This is the bone in your forearm located on the thumb side. A cast or splint is used to protect and keep your injured bone from moving. The cast or splint will be on generally for about 5 to 6 weeks, with individual variations. HOME CARE INSTRUCTIONS   Keep the injured part elevated while sitting or lying down. Keep the injury above the level of your heart (the center of the chest). This will decrease swelling and pain.  Apply ice to the injury for 15-20 minutes, 03-04 times per day while awake, for 2 days. Put the ice in a plastic bag and place a towel between the bag of ice and  your cast or splint.  Move your fingers to avoid stiffness and minimize swelling.  If you have a plaster or fiberglass cast:  Do not try to scratch the skin under the cast using sharp or pointed objects.  Check the skin around the cast every day. You may put lotion on any red or sore areas.  Keep your cast dry and clean.  If you have a plaster splint:  Wear the splint as directed.  You may loosen the elastic around the splint if your fingers become numb, tingle, or turn cold or blue.  Do not put pressure on any part of your cast or splint. It may break. Rest your cast only on a pillow for the first 24 hours until it is fully hardened.  Your cast or splint can be protected during bathing with a plastic bag. Do not lower the cast or splint into water.  Only take over-the-counter or prescription medicines for pain, discomfort, or fever as directed by your caregiver. SEEK IMMEDIATE MEDICAL CARE IF:   Your cast gets damaged or breaks.  You have more severe pain or swelling than you did before getting the cast.  You have severe pain when stretching your fingers.  There is a bad smell, new stains and/or pus-like (purulent) drainage coming from under the cast.  Your fingers or hand turn pale or blue and become cold or your loose feeling. Document Released: 04/28/2006 Document Revised: 02/07/2012 Document Reviewed: 07/25/2006 [facility] Patient Information 2014 [place].

## 2014-04-05 NOTE — ED Provider Notes (Signed)
CSN: [id number]     Arrival date & time 04/05/14  1636 History   First MD Initiated Contact with Patient 04/05/14 1736     Chief Complaint  Patient presents with  . Arm Injury   (Consider location/radiation/quality/duration/timing/severity/associated sxs/prior Treatment) HPI Comments: 38 year old female presents complaining of left wrist pain. Earlier today she was cleaning her child's room when she slipped and had a fall on outstretched hand. She had immediate severe pain in the wrist. She has also had some swelling, and some tingling in the hand distal to the wrist. Pain is constant and is worse with any movement. She has no other injury. No previous history of wrist injuries.  Patient is a 38 y.o. female presenting with arm injury.  Arm Injury   Past Medical History  Diagnosis Date  . Obesity   . PONV (postoperative nausea and vomiting)   . Diabetes mellitus     GDM diet controlled   Past Surgical History  Procedure Laterality Date  . Cesarean section    . Tonsillectomy    . Tympanostomy tube placement    . Cesarean section  09/19/2012    Procedure: CESAREAN SECTION;  Surgeon: Daria Pastures, MD;  Location: [facility];  Service: Obstetrics;  Laterality: N/A;  . Tubal ligation  09/19/2012    Procedure: BILATERAL TUBAL LIGATION;  Surgeon: Daria Pastures, MD;  Location: [facility];  Service: Obstetrics;  Laterality: Bilateral;   Family History  Problem Relation Age of Onset  . Asthma Other   . Cancer Other    History  Substance Use Topics  . Smoking status: Never Smoker   . Smokeless tobacco: Not on file  . Alcohol Use: No   OB History   Grav Para Term Preterm Abortions TAB SAB Ect Mult Living   1 1 1      1 3      Review of Systems  Musculoskeletal: Positive for arthralgias.       See history of present illness  Neurological: Negative for numbness.  All other systems reviewed and are negative.   Allergies  Shellfish allergy  Home Medications   Prior to  Admission medications   Medication Sig Start Date End Date Taking? Authorizing Provider  omeprazole (PRILOSEC OTC) 20 MG tablet Take 20 mg by mouth daily as needed.    Historical Provider, MD  oxyCODONE-acetaminophen (PERCOCET/ROXICET) 5-325 MG per tablet Take 1-2 tablets by mouth every 4 (four) hours as needed (moderate - severe pain). 09/21/12   Daria Pastures, MD  oxyCODONE-acetaminophen (ROXICET) 5-325 MG per tablet Take 1 tablet by mouth every 4 (four) hours as needed for pain. 09/22/12   Olga Millers, MD  Prenatal Vit-Fe Fumarate-FA (PRENATAL MULTIVITAMIN) TABS Take 1 tablet by mouth daily.    Historical Provider, MD   BP 114/61  Pulse 77  Temp(Src) 98.4 F (36.9 C) (Oral)  Resp 18  SpO2 98%  LMP 03/29/2014 Physical Exam  Nursing note and vitals reviewed. Constitutional: She is oriented to person, place, and time. Vital signs are normal. She appears well-developed and well-nourished. No distress.  HENT:  Head: Normocephalic and atraumatic.  Pulmonary/Chest: Effort normal. No respiratory distress.  Musculoskeletal:       Left wrist: She exhibits tenderness (severe, diffuse).       Left hand: She exhibits normal capillary refill. Normal sensation noted.  Neurological: She is alert and oriented to person, place, and time. She has normal strength. No sensory deficit. Coordination normal.  Skin: Skin is warm  and dry. No rash noted. She is not diaphoretic.  Psychiatric: She has a normal mood and affect. Judgment normal.    ED Course  Procedures (including critical care time) Labs Review Labs Reviewed - No data to display  Imaging Review Dg Wrist Complete Left  04/05/2014   CLINICAL DATA:  Status post fall.  Left wrist pain.  EXAM: LEFT WRIST - COMPLETE 3+ VIEW  COMPARISON:  None.  FINDINGS: There is a nondisplaced fracture of the distal radius at the metaphysis. No disruption at articular surface is identified. No other fracture is seen. Soft tissue swelling about the wrist  is noted.  IMPRESSION: Nondisplaced metaphysis seal fracture distal left radius.   Electronically Signed   By: Inge Rise M.D.   On: 04/05/2014 17:37     MDM   1. Distal radial fracture    Placed in a sugar tong splint. Patient declines any pain medications. She will call or follow-Dr. Kizzie Fantasia Monday for followup appointment.  Keep elevated, apply ice packs.    Liam Graham, PA-C 04/05/14 [phone]

## 2014-04-05 NOTE — Progress Notes (Signed)
Orthopedic Tech Progress Note Patient Details:  Morgan Hansen 09-05-1976 [id number]  Ortho Devices Type of Ortho Device: Ace wrap;Arm sling;Sugartong splint Ortho Device/Splint Location: LUE Ortho Device/Splint Interventions: Ordered;Application   Braulio Bosch 04/05/2014, 6:23 PM

## 2014-04-06 NOTE — ED Provider Notes (Signed)
Medical screening examination/treatment/procedure(s) were performed by non-physician practitioner and as supervising physician I was immediately available for consultation/collaboration.  Philipp Deputy, M.D.  Harden Mo, MD 04/06/14 [phone]

## 2014-09-30 ENCOUNTER — Encounter (HOSPITAL_COMMUNITY): Payer: Self-pay | Admitting: Emergency Medicine

## 2014-12-27 ENCOUNTER — Other Ambulatory Visit: Payer: Self-pay | Admitting: Obstetrics and Gynecology

## 2015-12-10 DIAGNOSIS — L718 Other rosacea: Secondary | ICD-10-CM | POA: Diagnosis not present

## 2015-12-10 DIAGNOSIS — Z973 Presence of spectacles and contact lenses: Secondary | ICD-10-CM | POA: Diagnosis not present

## 2015-12-10 DIAGNOSIS — H5213 Myopia, bilateral: Secondary | ICD-10-CM | POA: Diagnosis not present

## 2016-01-02 MED FILL — valACYclovir HCL 1 GM TABS: 1 | 30 days supply | Qty: 30 | Fill #2

## 2016-02-12 MED FILL — valACYclovir HCL 1 GM TABS: 1 | 30 days supply | Qty: 30 | Fill #3

## 2016-04-14 DIAGNOSIS — Z0000 Encounter for general adult medical examination without abnormal findings: Secondary | ICD-10-CM | POA: Diagnosis not present

## 2016-04-14 MED FILL — valACYclovir HCL 1 GM TABS: 1 | 30 days supply | Qty: 30 | Fill #4

## 2016-04-21 DIAGNOSIS — E6609 Other obesity due to excess calories: Secondary | ICD-10-CM | POA: Diagnosis not present

## 2016-04-21 DIAGNOSIS — Z0000 Encounter for general adult medical examination without abnormal findings: Secondary | ICD-10-CM | POA: Diagnosis not present

## 2016-05-30 ENCOUNTER — Ambulatory Visit (HOSPITAL_COMMUNITY): Payer: 59

## 2016-05-30 ENCOUNTER — Ambulatory Visit (HOSPITAL_COMMUNITY)
Admission: EM | Admit: 2016-05-30 | Discharge: 2016-05-30 | Disposition: A | Discharge status: Home / Self Care | Payer: 59 | Attending: Emergency Medicine | Admitting: Emergency Medicine

## 2016-05-30 ENCOUNTER — Encounter (HOSPITAL_COMMUNITY): Payer: Self-pay | Admitting: Emergency Medicine

## 2016-05-30 DIAGNOSIS — Y92832 Beach as the place of occurrence of the external cause: Secondary | ICD-10-CM | POA: Diagnosis not present

## 2016-05-30 DIAGNOSIS — S86812A Strain of other muscle(s) and tendon(s) at lower leg level, left leg, initial encounter: Secondary | ICD-10-CM | POA: Diagnosis not present

## 2016-05-30 DIAGNOSIS — S86112A Strain of other muscle(s) and tendon(s) of posterior muscle group at lower leg level, left leg, initial encounter: Secondary | ICD-10-CM | POA: Diagnosis not present

## 2016-05-30 DIAGNOSIS — W1692XA Jumping or diving into unspecified water causing other injury, initial encounter: Secondary | ICD-10-CM | POA: Diagnosis not present

## 2016-05-30 DIAGNOSIS — M79605 Pain in left leg: Secondary | ICD-10-CM | POA: Diagnosis not present

## 2016-05-30 NOTE — Discharge Instructions (Signed)
Medial Head Gastrocnemius Tear With Rehab Medial head gastrocnemius tear, also called tennis leg, is a tear (strain) in a muscle or tendon of the inner portion (medial head) of one of the calf muscles (gastrocnemius). The inner portion of the calf muscle attaches to the thigh bone (femur) and is responsible for bending the knee and straightening the foot (standing "on tiptoe"). Strains are classified into three categories. Grade 1 strains cause pain, but the tendon is not lengthened. Grade 2 strains include a lengthened ligament, due to the ligament being stretched or partially ruptured. With grade 2 strains there is still function, although function may be decreased. Grade 3 strains involve a complete tear of the tendon or muscle, and function is usually impaired. SYMPTOMS   Sudden "pop" or tear felt at the time of injury.  Pain, tenderness, swelling, warmth, or redness over the middle inner calf.  Pain and weakness with ankle motion, especially flexing the ankle against resistance, as well as pain with lifting up the foot (extending the ankle).  Bruising (contusion) of the calf, heel, and sometimes the foot within 48 hours of injury.  Muscle spasm in the calf. CAUSES  Muscle and ligament strains occur when a force is placed on the muscle or ligament that is greater than it can handle. Common causes of injury include:  Direct hit (trauma) to the calf.  Sudden forceful pushing off or landing on the foot (jumping, landing, serving a tennis ball, lunging). RISK INCREASES WITH:  Sports that require sudden, explosive calf muscle contraction, such as those involving jumping (basketball), hill running, quick starts (running), or lunging (racquetball, tennis).  Contact sports (football, soccer, hockey).  Poor strength and flexibility.  Previous lower limb injury. PREVENTION  Warm up and stretch properly before activity.  Allow for adequate recovery between workouts.  Maintain physical  fitness:  Strength, flexibility, and endurance.  Cardiovascular fitness.  Learn and use proper exercise technique.  Complete rehabilitation after lower limb injury, before returning to competition or practice. PROGNOSIS  If treated properly, tennis leg usually heals within 6 weeks of nonsurgical treatment.  RELATED COMPLICATIONS   Longer healing time, if not properly treated or if not given enough time to heal.  Recurring symptoms and injury, if activity is resumed too soon, with overuse, with a direct blow, or with poor technique.  If untreated, may progress to a complete tear (rare) or other injury, due to limping and favoring of the injured leg.  Persistent limping, due to scarring and shortening of the calf muscles, as a result of inadequate rehabilitation.  Prolonged disability. TREATMENT  Treatment first involves the use of ice and medication to help reduce pain and inflammation. The use of strengthening and stretching exercises may help reduce pain with activity. These exercises may be performed at home or with a therapist. For severe injuries, referral to a therapist may be needed for further evaluation and treatment. Your caregiver may advise that you wear a brace to help healing. Sometimes, crutches are needed until you can walk without limping. Rarely, surgery is needed.  MEDICATION   If pain medicine is needed, nonsteroidal anti-inflammatory medicines (aspirin and ibuprofen), or other minor pain relievers (acetaminophen), are often advised.  Do not take pain medicine for 7 days before surgery.  Prescription pain relievers may be given, if your caregiver thinks they are needed. Use only as directed and only as much as you need. HEAT AND COLD  Cold treatment (icing) should be applied for 10 to 15 minutes every  2 to 3 hours for inflammation and pain, and immediately after activity that aggravates your symptoms. Use ice packs or an ice massage.  Heat treatment may be used  before performing stretching and strengthening activities prescribed by your caregiver, physical therapist, or athletic trainer. Use a heat pack or a warm water soak. SEEK MEDICAL CARE IF:   Symptoms get worse or do not improve in 2 weeks, despite treatment.  Numbness or tingling develops.  New, unexplained symptoms develop. (Drugs used in treatment may produce side effects.) EXERCISES  RANGE OF MOTION (ROM) AND STRETCHING EXERCISES - Medial Head Gastrocnemius Tear (Tennis Leg) These exercises may help you when beginning to rehabilitate your injury. Your symptoms may resolve with or without further involvement from your physician, physical therapist, or athletic trainer. While completing these exercises, remember:   Restoring tissue flexibility helps normal motion to return to the joints. This allows healthier, less painful movement and activity.  An effective stretch should be held for at least 30 seconds.  A stretch should never be painful. You should only feel a gentle lengthening or release in the stretched tissue. STRETCH - Gastrocsoleus  Sit with your right / left leg extended. Holding onto both ends of a belt or towel, loop it around the ball of your foot.  Keeping your right / left ankle and foot relaxed and your knee straight, pull your foot and ankle toward you using the belt.  You should feel a gentle stretch behind your calf or knee. Hold this position for __________ seconds. Repeat __________ times. Complete this stretch __________ times per day.  RANGE OF MOTION - Ankle Dorsiflexion, Active Assisted   Remove your shoes and sit on a chair, preferably not on a carpeted surface.  Place your right / left foot directly under the knee. Extend your opposite leg for support.  Keeping your heel down, slide your right / left foot back toward the chair, until you feel a stretch at your ankle or calf. If you do not feel a stretch, slide your bottom forward to the edge of the chair,  while still keeping your heel down.  Hold this stretch for __________ seconds. Repeat __________ times. Complete this stretch __________ times per day.  STRETCH - Gastroc, Standing   Place your hands on a wall.  Extend your right / left leg behind you, keeping the front knee somewhat bent.  Slightly point your toes inward on your back foot.  Keeping your right / left heel on the floor and your knee straight, shift your weight toward the wall, not allowing your back to arch.  You should feel a gentle stretch in the right / left calf. Hold this position for __________ seconds. Repeat __________ times. Complete this stretch __________ times per day. STRETCH - Soleus, Standing   Place your hands on a wall.  Extend your right / left leg behind you, keeping the other knee somewhat bent.  Point your toes of your back foot slightly inward.  Keep your right / left heel on the floor, bend your back knee, and slightly shift your weight over the back leg so that you feel a gentle stretch deep in your back calf.  Hold this position for __________ seconds. Repeat __________ times. Complete this stretch __________ times per day. STRETCH - Gastrocsoleus, Standing Note: This exercise can place a lot of stress on your foot and ankle. Please complete this exercise only if specifically instructed by your caregiver.   Place the ball of your right /  left foot on a step, keeping your other foot firmly on the same step.  Hold on to the wall or a rail for balance.  Slowly lift your other foot, allowing your body weight to press your heel down over the edge of the step.  You should feel a stretch in your right / left calf.  Hold this position for __________ seconds.  Repeat this exercise with a slight bend in your right / left knee. Repeat __________ times. Complete this stretch __________ times per day.  STRENGTHENING EXERCISES - Medial Head Gastrocnemius Tear (Tennis Leg) These exercises may help  you when beginning to rehabilitate your injury. They may resolve your symptoms with or without further involvement from your physician, physical therapist, or athletic trainer. While completing these exercises, remember:   Muscles can gain both the endurance and the strength needed for everyday activities through controlled exercises.  Complete these exercises as instructed by your physician, physical therapist, or athletic trainer. Increase the resistance and repetitions only as guided by your caregiver. STRENGTH - Plantar-flexors  Sit with your right / left leg extended. Holding onto both ends of a rubber exercise band or tubing, loop it around the ball of your foot. Keep a slight tension in the band.  Slowly push your toes away from you, pointing them downward.  Hold this position for __________ seconds. Return slowly, controlling the tension in the band. Repeat __________ times. Complete this exercise __________ times per day.  STRENGTH - Plantar-flexors  Stand with your feet shoulder width apart. Steady yourself with a wall or table, using as little support as needed.  Keeping your weight evenly spread over the width of your feet, rise up on your toes.*  Hold this position for __________ seconds. Repeat __________ times. Complete this exercise __________ times per day.  *If this is too easy, shift your weight toward your right / left leg until you feel challenged. Ultimately, you may be asked to do this exercise while standing on your right / left foot only. STRENGTH - Plantar-flexors, Eccentric Note: This exercise can place a lot of stress on your foot and ankle. Please complete this exercise only if specifically instructed by your caregiver.   Place the balls of your feet on a step. With your hands, use only enough support from a wall or rail to keep your balance.  Keep your knees straight and rise up on your toes.  Slowly shift your weight entirely to your right / left toes and  pick up your opposite foot. Gently and with controlled movement, lower your weight through your right / left foot so that your heel drops below the level of the step. You will feel a slight stretch in the back of your right / left calf.  Use the healthy leg to help rise up onto the balls of both feet, then lower weight only onto the right / left leg again. Build up to 15 repetitions. Then progress to 3 sets of 15 repetitions.*  After completing the above exercise, complete the same exercise with a slight knee bend (about 30 degrees). Again, build up to 15 repetitions. Then progress to 3 sets of 15 repetitions.* Perform this exercise __________ times per day.  *When you easily complete 3 sets of 15, your physician, physical therapist, or athletic trainer may advise you to add resistance, by wearing a backpack filled with additional weight.   This information is not intended to replace advice given to you by your health care provider. Make  sure you discuss any questions you have with your health care provider.   Document Released: 11/15/2005 Document Revised: 12/06/2014 Document Reviewed: 02/27/2009 Elsevier Interactive Patient Education [employer].

## 2016-05-30 NOTE — ED Provider Notes (Signed)
CSN: [id number]     Arrival date & time 05/30/16  1418 History   First MD Initiated Contact with Patient 05/30/16 1530     Chief Complaint  Patient presents with  . Leg Pain   (Consider location/radiation/quality/duration/timing/severity/associated sxs/prior Treatment) HPI Comments: 40 year old female states that she was at the beach 4 days ago and she was jumping waves. She landed hard on the outstretched left leg and experienced pain and a popping of below the knee. She is complaining of pain and swelling to the calf muscle. Dorsiflexion is intact but stretches the calf and causes pain. Plantar flexion causes pain in the calf. When ambulating she states chest wall: The forefoot. Placing weight on the heel increases the pain. She did not have pain in the ankle at any time however after walking and maintaining a leg in a dependent position she has developed ecchymosis around the ankle. Denies pain to the knee or thigh.    Past Medical History  Diagnosis Date  . Obesity   . PONV (postoperative nausea and vomiting)   . Diabetes mellitus     GDM diet controlled   Past Surgical History  Procedure Laterality Date  . Cesarean section    . Tonsillectomy    . Tympanostomy tube placement    . Cesarean section  09/19/2012    Procedure: CESAREAN SECTION;  Surgeon: Daria Pastures, MD;  Location: [facility];  Service: Obstetrics;  Laterality: N/A;  . Tubal ligation  09/19/2012    Procedure: BILATERAL TUBAL LIGATION;  Surgeon: Daria Pastures, MD;  Location: [facility];  Service: Obstetrics;  Laterality: Bilateral;   Family History  Problem Relation Age of Onset  . Asthma Other   . Cancer Other    Social History  Substance Use Topics  . Smoking status: Never Smoker   . Smokeless tobacco: None  . Alcohol Use: No   OB History    Gravida Para Term Preterm AB TAB SAB Ectopic Multiple Living   1 1 1      1 3      Review of Systems  Constitutional: Negative.  Negative for fever, chills and  activity change.  HENT: Negative.   Respiratory: Negative.   Cardiovascular: Negative.   Musculoskeletal: Positive for myalgias and gait problem. Negative for back pain, joint swelling, neck pain and neck stiffness.       As per HPI  Skin: Negative for color change, pallor and rash.  Neurological: Negative.   All other systems reviewed and are negative.   Allergies  Shellfish allergy  Home Medications   Prior to Admission medications   Medication Sig Start Date End Date Taking? Authorizing Provider  omeprazole (PRILOSEC OTC) 20 MG tablet Take 20 mg by mouth daily as needed.    Historical Provider, MD  oxyCODONE-acetaminophen (PERCOCET/ROXICET) 5-325 MG per tablet Take 1-2 tablets by mouth every 4 (four) hours as needed (moderate - severe pain). 09/21/12   Bobbye Charleston, MD  oxyCODONE-acetaminophen (ROXICET) 5-325 MG per tablet Take 1 tablet by mouth every 4 (four) hours as needed for pain. 09/22/12   Olga Millers, MD  Prenatal Vit-Fe Fumarate-FA (PRENATAL MULTIVITAMIN) TABS Take 1 tablet by mouth daily.    Historical Provider, MD   Meds Ordered and Administered this Visit  Medications - No data to display  BP 123/63 mmHg  Pulse 66  Temp(Src) 98.6 F (37 C) (Oral)  Resp 16  SpO2 100%  Breastfeeding? No No data found.   Physical Exam  Constitutional: She  is oriented to person, place, and time. She appears well-developed and well-nourished. No distress.  HENT:  Head: Normocephalic and atraumatic.  Eyes: EOM are normal.  Neck: Normal range of motion. Neck supple.  Cardiovascular: Normal rate.   Pulmonary/Chest: Effort normal.  Musculoskeletal:  Left lower extremity with calf tenderness and swelling. No discoloration. No tenderness, deformity or swelling to the knee or ankle. As per history of present illness there is minor ecchymosis to the medial left ankle but no tenderness. Demonstrates full range of motion. No deformity or swelling to the ankle or foot. Distal  neurovascular motor sensory is intact. Pedal pulses intact. No tenderness to the distal Achilles tendon however pressure applied to the tendon produces pain to the calf muscle.  Neurological: She is alert and oriented to person, place, and time. No cranial nerve deficit.  Skin: Skin is warm and dry.  Psychiatric: She has a normal mood and affect.  Nursing note and vitals reviewed.   ED Course  Procedures (including critical care time)  Labs Review Labs Reviewed - No data to display  Imaging Review Dg Tibia/fibula Left  05/30/2016  CLINICAL DATA:  Jumped, landing hard on straightened leg in the ocean. LEFT calf pain for 5 days. EXAM: LEFT TIBIA AND FIBULA - 2 VIEW COMPARISON:  None. FINDINGS: There is no evidence of fracture or other focal bone lesions. Soft tissues are unremarkable. Mild degenerative change of the patellofemoral compartment suspected though not tailored for evaluation. IMPRESSION: Negative. Electronically Signed   By: Elon Alas M.D.   On: 05/30/2016 16:39   X-ray demonstrates an irregularity in the contour of the calf muscle approximately one third of the way down or distally. This may represent a tear of the muscle. This is in the area of swelling and marked tenderness.  Visual Acuity Review  Right Eye Distance:   Left Eye Distance:   Bilateral Distance:    Right Eye Near:   Left Eye Near:    Bilateral Near:         MDM   1. Sprain, gastrocnemius, left, initial encounter   2. Gastrocnemius muscle tear, left, initial encounter    Follow directions as written. Limit weightbearing. Cam walker as directed when up and about. Continue NSAIDs and keep elevated. Call the orthopedist listed on page one for an appointment as soon as possible.    Janne Napoleon, NP 05/30/16 1704

## 2016-05-30 NOTE — ED Notes (Signed)
Patient returned from Xray by [facility] shuttle.

## 2016-05-30 NOTE — ED Notes (Signed)
Patient transported to X-ray bu [place] shuttle

## 2016-05-30 NOTE — ED Notes (Signed)
The patient presented to the [facility] with a complaint of left leg pain secondary to landing on her leg wrong when playing in the ocean 5 days ago.

## 2016-06-04 DIAGNOSIS — S86812A Strain of other muscle(s) and tendon(s) at lower leg level, left leg, initial encounter: Secondary | ICD-10-CM | POA: Diagnosis not present

## 2016-10-04 MED FILL — valACYclovir HCL 1 GM TABS: 1 | 30 days supply | Qty: 30 | Fill #0

## 2016-10-27 DIAGNOSIS — Z91018 Allergy to other foods: Secondary | ICD-10-CM | POA: Diagnosis not present

## 2016-10-27 DIAGNOSIS — H6980 Other specified disorders of Eustachian tube, unspecified ear: Secondary | ICD-10-CM | POA: Diagnosis not present

## 2016-10-27 DIAGNOSIS — J310 Chronic rhinitis: Secondary | ICD-10-CM | POA: Diagnosis not present

## 2016-10-27 DIAGNOSIS — H8110 Benign paroxysmal vertigo, unspecified ear: Secondary | ICD-10-CM | POA: Diagnosis not present

## 2016-11-11 DIAGNOSIS — H6980 Other specified disorders of Eustachian tube, unspecified ear: Secondary | ICD-10-CM | POA: Diagnosis not present

## 2016-11-11 DIAGNOSIS — H8110 Benign paroxysmal vertigo, unspecified ear: Secondary | ICD-10-CM | POA: Diagnosis not present

## 2016-12-06 DIAGNOSIS — H900 Conductive hearing loss, bilateral: Secondary | ICD-10-CM | POA: Diagnosis not present

## 2016-12-06 DIAGNOSIS — H8111 Benign paroxysmal vertigo, right ear: Secondary | ICD-10-CM | POA: Diagnosis not present

## 2016-12-06 DIAGNOSIS — R42 Dizziness and giddiness: Secondary | ICD-10-CM | POA: Diagnosis not present

## 2016-12-09 ENCOUNTER — Other Ambulatory Visit: Payer: Self-pay | Admitting: Obstetrics and Gynecology

## 2016-12-09 DIAGNOSIS — Z124 Encounter for screening for malignant neoplasm of cervix: Secondary | ICD-10-CM | POA: Diagnosis not present

## 2016-12-09 DIAGNOSIS — Z1231 Encounter for screening mammogram for malignant neoplasm of breast: Secondary | ICD-10-CM | POA: Diagnosis not present

## 2016-12-09 DIAGNOSIS — Z6838 Body mass index (BMI) 38.0-38.9, adult: Secondary | ICD-10-CM | POA: Diagnosis not present

## 2016-12-09 DIAGNOSIS — Z01419 Encounter for gynecological examination (general) (routine) without abnormal findings: Secondary | ICD-10-CM | POA: Diagnosis not present

## 2016-12-11 LAB — CYTOLOGY - PAP

## 2016-12-22 DIAGNOSIS — H8111 Benign paroxysmal vertigo, right ear: Secondary | ICD-10-CM | POA: Diagnosis not present

## 2016-12-29 DIAGNOSIS — H8111 Benign paroxysmal vertigo, right ear: Secondary | ICD-10-CM | POA: Diagnosis not present

## 2017-01-07 DIAGNOSIS — Z973 Presence of spectacles and contact lenses: Secondary | ICD-10-CM | POA: Diagnosis not present

## 2017-01-07 DIAGNOSIS — L718 Other rosacea: Secondary | ICD-10-CM | POA: Diagnosis not present

## 2017-01-07 DIAGNOSIS — H5213 Myopia, bilateral: Secondary | ICD-10-CM | POA: Diagnosis not present

## 2017-01-19 MED FILL — FLUTICASONE PROP 50 MCG SPR: 50 | 30 days supply | Qty: 16 | Fill #0

## 2017-01-19 MED FILL — valACYclovir HCL 1 GM TABS: 1 | 30 days supply | Qty: 30 | Fill #1

## 2017-03-18 MED FILL — valACYclovir HCL 1 GM TABS: 1 | 30 days supply | Qty: 30 | Fill #2

## 2017-04-28 DIAGNOSIS — Z0000 Encounter for general adult medical examination without abnormal findings: Secondary | ICD-10-CM | POA: Diagnosis not present

## 2017-05-03 DIAGNOSIS — J309 Allergic rhinitis, unspecified: Secondary | ICD-10-CM | POA: Diagnosis not present

## 2017-05-03 DIAGNOSIS — Z0000 Encounter for general adult medical examination without abnormal findings: Secondary | ICD-10-CM | POA: Diagnosis not present

## 2017-05-03 DIAGNOSIS — B009 Herpesviral infection, unspecified: Secondary | ICD-10-CM | POA: Diagnosis not present

## 2017-05-03 DIAGNOSIS — E785 Hyperlipidemia, unspecified: Secondary | ICD-10-CM | POA: Diagnosis not present

## 2017-05-03 MED FILL — valACYclovir HCL 1 GM TABS: 1 | 30 days supply | Qty: 30 | Fill #0

## 2017-05-03 MED FILL — FLUTICASONE PROP 50 MCG SPR: 50 | 60 days supply | Qty: 16 | Fill #0

## 2017-07-12 IMAGING — DX DG TIBIA/FIBULA 2V*L*
2 series · 2 of 2 positions shown · non-contrast
Comparison: None.

CLINICAL DATA: J[place], [place] hard on [place] leg [place] the
[place]. LEFT calf pain for 5 days.

EXAM:
LEFT TIBIA AND FIBULA - 2 VIEW

[x tib-fib ap left]
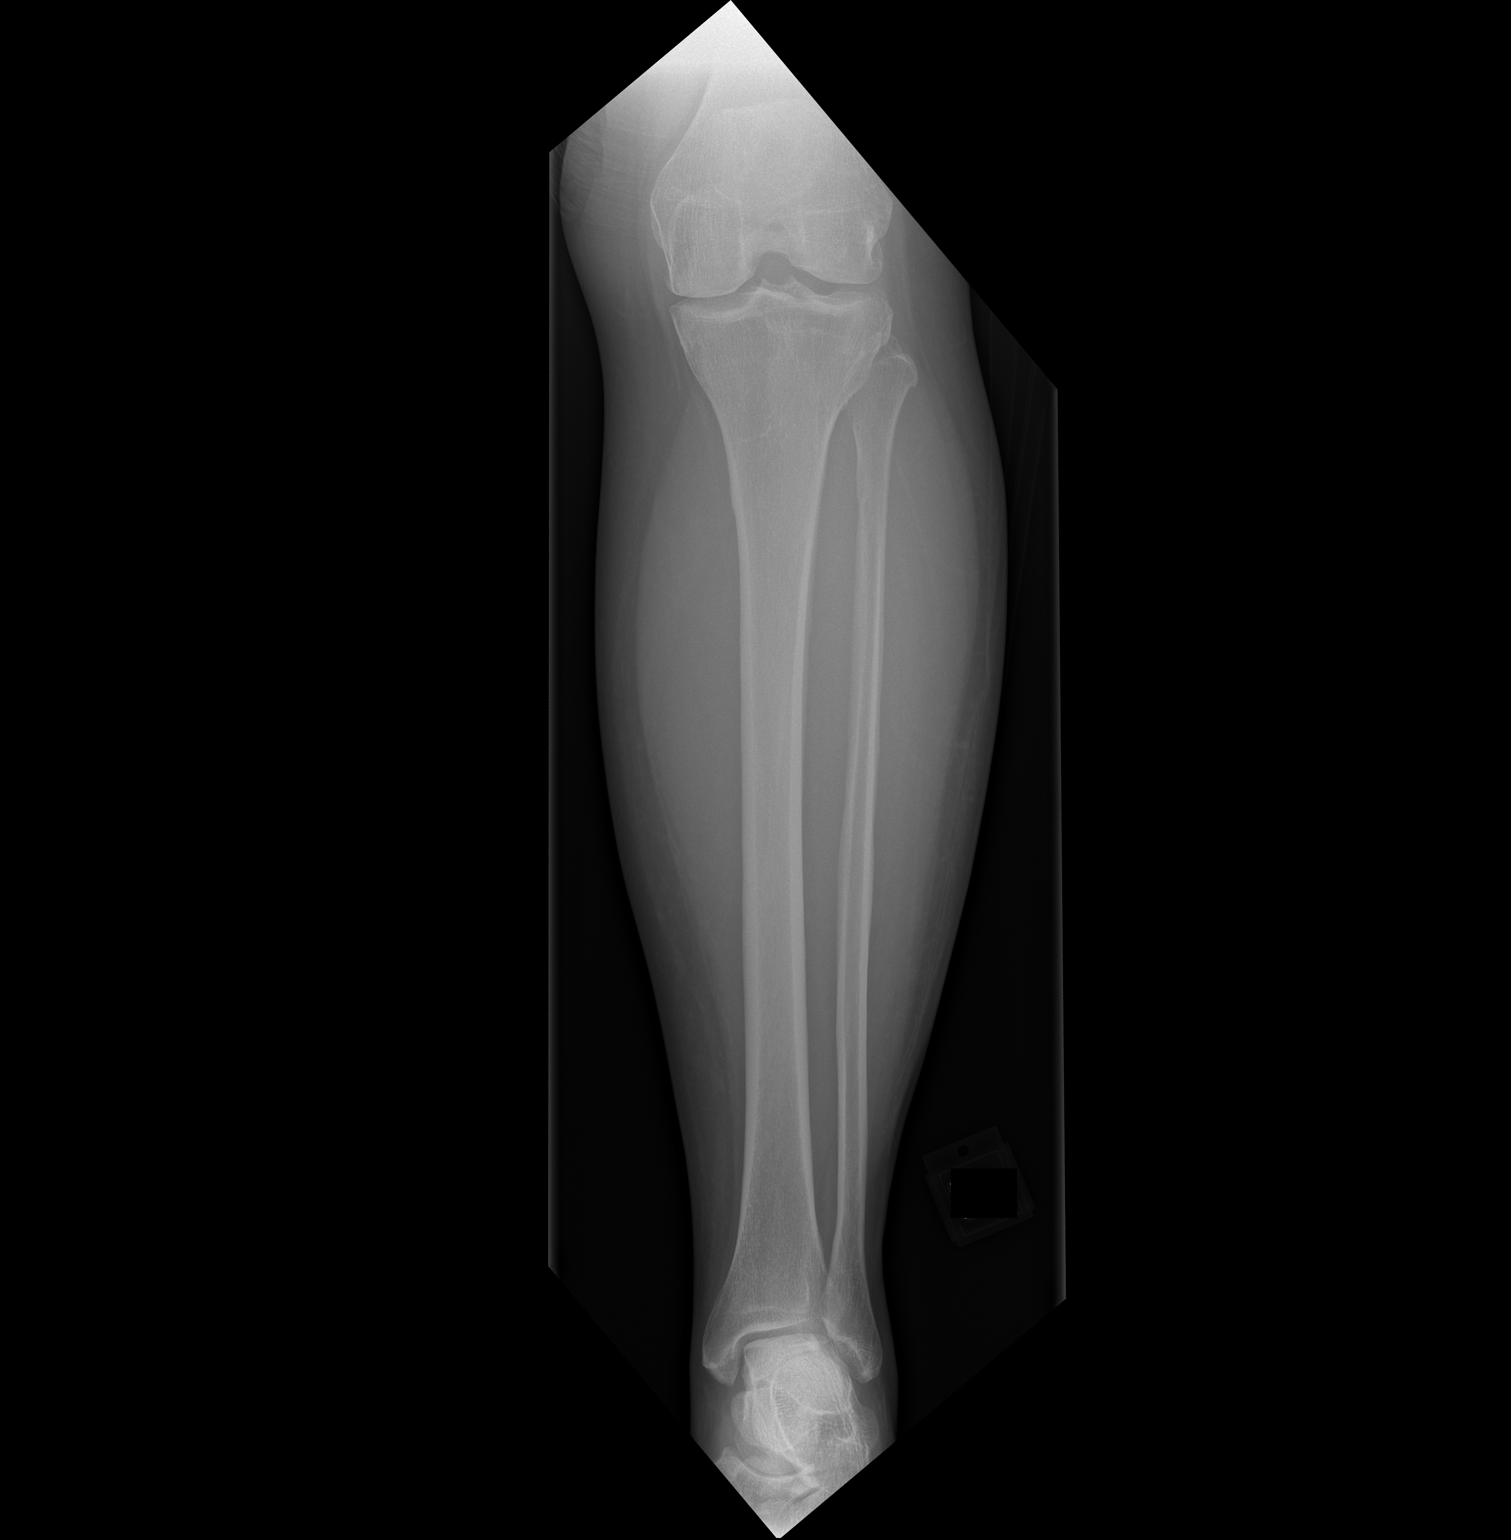

[x tib-fib lat left]
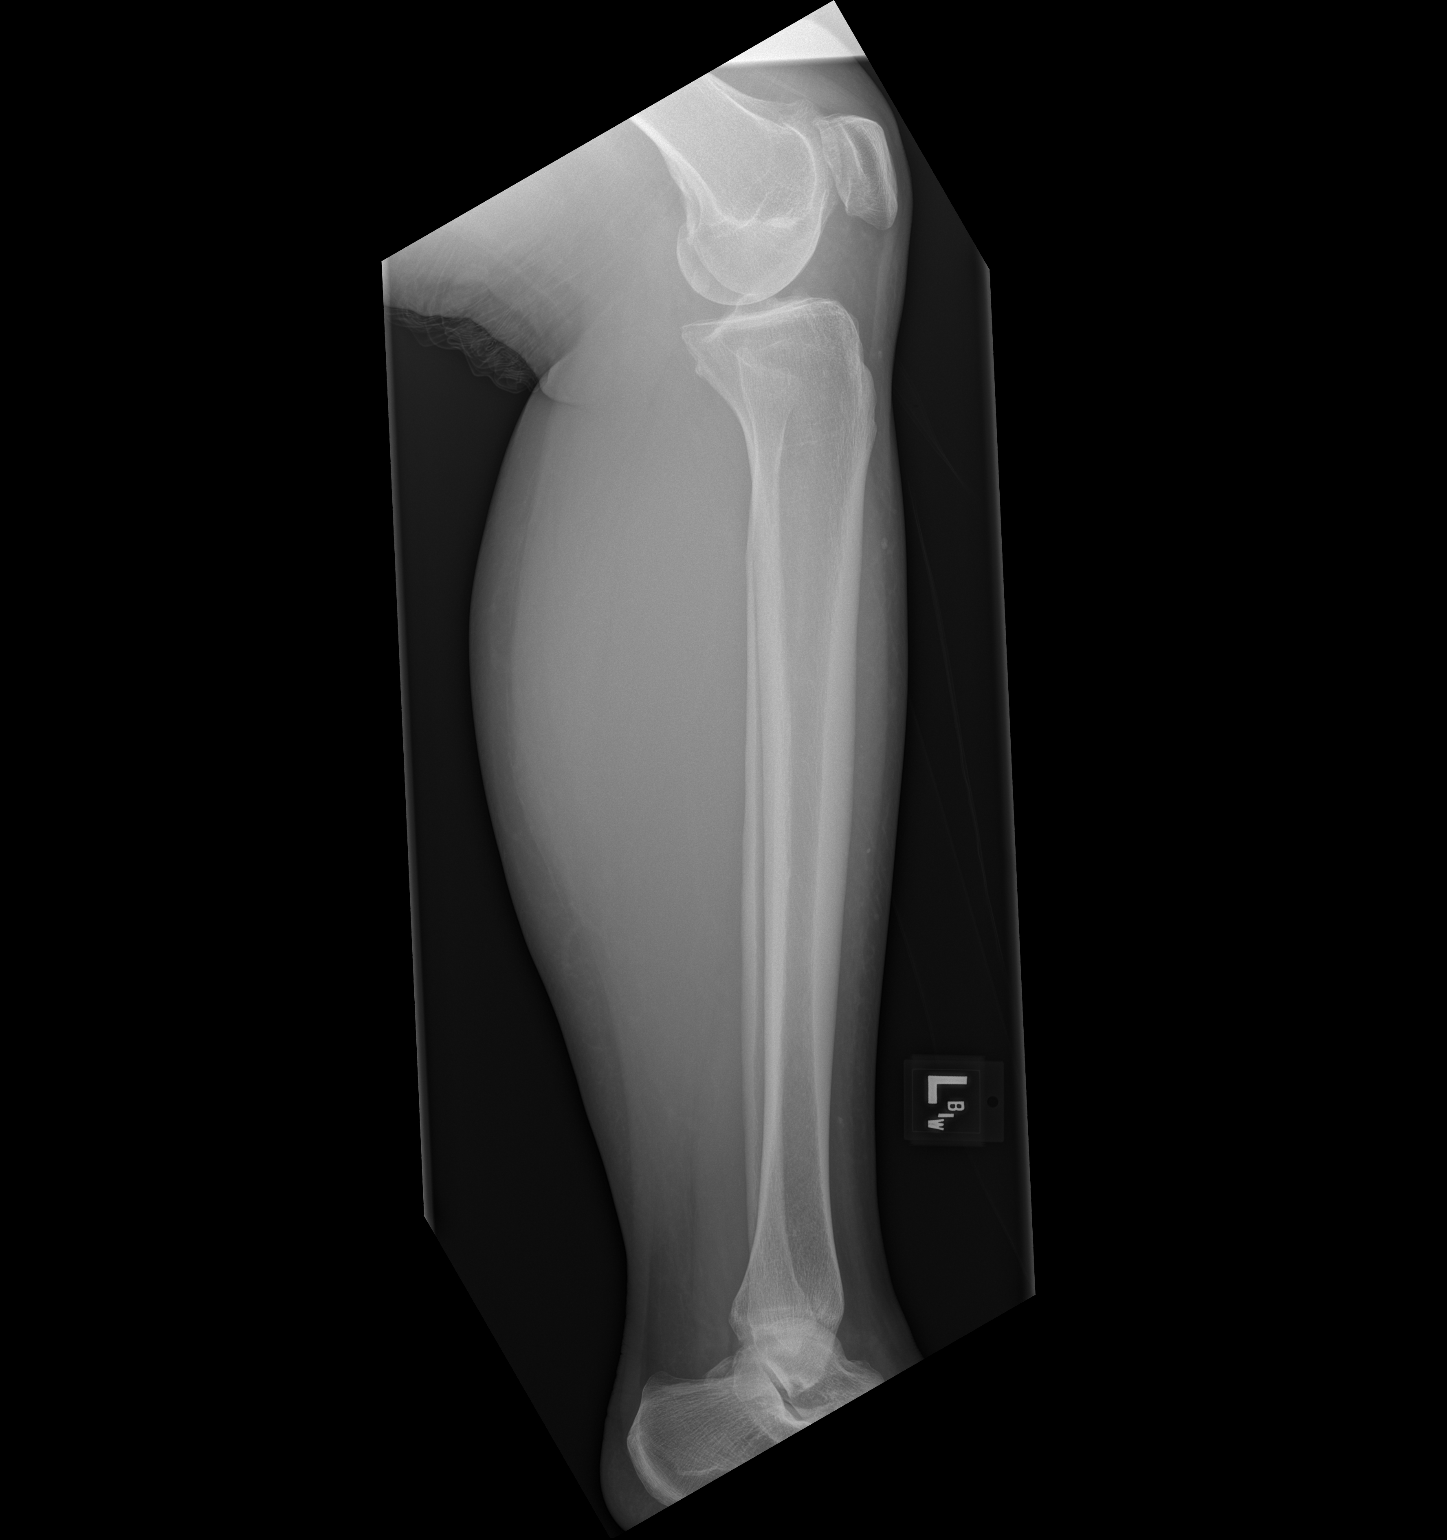

[2 of 2 positions shown; findings below may reference images not displayed]

FINDINGS: There is no evidence of fracture or other focal bone lesions. Soft
tissues are unremarkable. Mild degenerative change of the
patellofemoral compartment suspected though not tailored for
evaluation.
IMPRESSION: Negative.

## 2017-08-23 MED FILL — valACYclovir HCL 1 GM TABS: 1 | 30 days supply | Qty: 30 | Fill #1

## 2017-08-23 MED FILL — FLUTICASONE PROP 50 MCG SPR: 50 | 60 days supply | Qty: 16 | Fill #1

## 2017-12-29 MED FILL — valACYclovir HCL 1 GM TABS: 1 | 30 days supply | Qty: 30 | Fill #2

## 2017-12-29 MED FILL — FLUTICASONE PROP 50 MCG SPR: 50 | 60 days supply | Qty: 16 | Fill #2

## 2018-01-06 DIAGNOSIS — Z01419 Encounter for gynecological examination (general) (routine) without abnormal findings: Secondary | ICD-10-CM | POA: Diagnosis not present

## 2018-01-06 DIAGNOSIS — Z1231 Encounter for screening mammogram for malignant neoplasm of breast: Secondary | ICD-10-CM | POA: Diagnosis not present

## 2018-01-12 DIAGNOSIS — Z803 Family history of malignant neoplasm of breast: Secondary | ICD-10-CM | POA: Diagnosis not present

## 2018-01-12 DIAGNOSIS — Z8042 Family history of malignant neoplasm of prostate: Secondary | ICD-10-CM | POA: Diagnosis not present

## 2018-01-12 DIAGNOSIS — Z808 Family history of malignant neoplasm of other organs or systems: Secondary | ICD-10-CM | POA: Diagnosis not present

## 2018-01-12 DIAGNOSIS — H5213 Myopia, bilateral: Secondary | ICD-10-CM | POA: Diagnosis not present

## 2018-01-12 DIAGNOSIS — L718 Other rosacea: Secondary | ICD-10-CM | POA: Diagnosis not present

## 2018-01-12 DIAGNOSIS — Z973 Presence of spectacles and contact lenses: Secondary | ICD-10-CM | POA: Diagnosis not present

## 2018-01-19 DIAGNOSIS — M6250 Muscle wasting and atrophy, not elsewhere classified, unspecified site: Secondary | ICD-10-CM | POA: Diagnosis not present

## 2018-01-19 DIAGNOSIS — N393 Stress incontinence (female) (male): Secondary | ICD-10-CM | POA: Diagnosis not present

## 2018-01-26 DIAGNOSIS — N393 Stress incontinence (female) (male): Secondary | ICD-10-CM | POA: Diagnosis not present

## 2018-01-26 DIAGNOSIS — M6250 Muscle wasting and atrophy, not elsewhere classified, unspecified site: Secondary | ICD-10-CM | POA: Diagnosis not present

## 2018-02-02 DIAGNOSIS — M6250 Muscle wasting and atrophy, not elsewhere classified, unspecified site: Secondary | ICD-10-CM | POA: Diagnosis not present

## 2018-02-02 DIAGNOSIS — N393 Stress incontinence (female) (male): Secondary | ICD-10-CM | POA: Diagnosis not present

## 2018-02-09 DIAGNOSIS — N393 Stress incontinence (female) (male): Secondary | ICD-10-CM | POA: Diagnosis not present

## 2018-02-16 DIAGNOSIS — N393 Stress incontinence (female) (male): Secondary | ICD-10-CM | POA: Diagnosis not present

## 2018-03-02 DIAGNOSIS — Z7183 Encounter for nonprocreative genetic counseling: Secondary | ICD-10-CM | POA: Diagnosis not present

## 2018-03-02 DIAGNOSIS — N393 Stress incontinence (female) (male): Secondary | ICD-10-CM | POA: Diagnosis not present

## 2018-03-09 DIAGNOSIS — N393 Stress incontinence (female) (male): Secondary | ICD-10-CM | POA: Diagnosis not present

## 2018-03-16 DIAGNOSIS — N393 Stress incontinence (female) (male): Secondary | ICD-10-CM | POA: Diagnosis not present

## 2018-03-28 MED FILL — FLUTICASONE PROP 50 MCG SPR: 50 | 60 days supply | Qty: 16 | Fill #3

## 2018-03-28 MED FILL — FLUCONAZOLE 150 MG TABS: 150 | 2 days supply | Qty: 2 | Fill #0

## 2018-03-28 MED FILL — valACYclovir HCL 1 GM TABS: 1 | 30 days supply | Qty: 30 | Fill #3

## 2018-04-27 DIAGNOSIS — E785 Hyperlipidemia, unspecified: Secondary | ICD-10-CM | POA: Diagnosis not present

## 2018-04-27 DIAGNOSIS — Z0000 Encounter for general adult medical examination without abnormal findings: Secondary | ICD-10-CM | POA: Diagnosis not present

## 2018-05-04 DIAGNOSIS — Z0000 Encounter for general adult medical examination without abnormal findings: Secondary | ICD-10-CM | POA: Diagnosis not present

## 2018-05-04 DIAGNOSIS — E559 Vitamin D deficiency, unspecified: Secondary | ICD-10-CM | POA: Diagnosis not present

## 2018-05-04 DIAGNOSIS — Z803 Family history of malignant neoplasm of breast: Secondary | ICD-10-CM | POA: Diagnosis not present

## 2018-05-04 DIAGNOSIS — B009 Herpesviral infection, unspecified: Secondary | ICD-10-CM | POA: Diagnosis not present

## 2018-05-04 DIAGNOSIS — L989 Disorder of the skin and subcutaneous tissue, unspecified: Secondary | ICD-10-CM | POA: Diagnosis not present

## 2018-05-04 DIAGNOSIS — J309 Allergic rhinitis, unspecified: Secondary | ICD-10-CM | POA: Diagnosis not present

## 2018-05-04 DIAGNOSIS — Z23 Encounter for immunization: Secondary | ICD-10-CM | POA: Diagnosis not present

## 2018-05-04 MED FILL — valACYclovir HCL 1 GM TABS: 1 | 30 days supply | Qty: 30 | Fill #0

## 2018-09-01 MED FILL — FLUTICASONE PROP 50 MCG SPR: 50 | 60 days supply | Qty: 16 | Fill #0

## 2018-09-01 MED FILL — valACYclovir HCL 1 GM TABS: 1 | 30 days supply | Qty: 30 | Fill #1

## 2018-12-05 DIAGNOSIS — D2261 Melanocytic nevi of right upper limb, including shoulder: Secondary | ICD-10-CM | POA: Diagnosis not present

## 2018-12-05 DIAGNOSIS — L814 Other melanin hyperpigmentation: Secondary | ICD-10-CM | POA: Diagnosis not present

## 2018-12-05 DIAGNOSIS — L7211 Pilar cyst: Secondary | ICD-10-CM | POA: Diagnosis not present

## 2018-12-05 DIAGNOSIS — L918 Other hypertrophic disorders of the skin: Secondary | ICD-10-CM | POA: Diagnosis not present

## 2018-12-05 DIAGNOSIS — D692 Other nonthrombocytopenic purpura: Secondary | ICD-10-CM | POA: Diagnosis not present

## 2018-12-05 DIAGNOSIS — L821 Other seborrheic keratosis: Secondary | ICD-10-CM | POA: Diagnosis not present

## 2018-12-05 DIAGNOSIS — D225 Melanocytic nevi of trunk: Secondary | ICD-10-CM | POA: Diagnosis not present

## 2018-12-05 DIAGNOSIS — D2262 Melanocytic nevi of left upper limb, including shoulder: Secondary | ICD-10-CM | POA: Diagnosis not present

## 2018-12-05 DIAGNOSIS — D2271 Melanocytic nevi of right lower limb, including hip: Secondary | ICD-10-CM | POA: Diagnosis not present

## 2019-01-10 DIAGNOSIS — H5213 Myopia, bilateral: Secondary | ICD-10-CM | POA: Diagnosis not present

## 2019-01-10 DIAGNOSIS — H52222 Regular astigmatism, left eye: Secondary | ICD-10-CM | POA: Diagnosis not present

## 2019-01-10 DIAGNOSIS — H524 Presbyopia: Secondary | ICD-10-CM | POA: Diagnosis not present

## 2019-02-16 MED FILL — valACYclovir HCL 1 GM TABS: 1 | 30 days supply | Qty: 30 | Fill #2

## 2019-04-26 MED FILL — valACYclovir HCL 1 GM TABS: 1 | 30 days supply | Qty: 30 | Fill #3

## 2019-04-26 MED FILL — FLUTICASONE PROP 50 MCG SPR: 50 | 60 days supply | Qty: 16 | Fill #1

## 2019-05-04 DIAGNOSIS — Z0000 Encounter for general adult medical examination without abnormal findings: Secondary | ICD-10-CM | POA: Diagnosis not present

## 2019-05-04 DIAGNOSIS — E559 Vitamin D deficiency, unspecified: Secondary | ICD-10-CM | POA: Diagnosis not present

## 2019-05-09 DIAGNOSIS — E559 Vitamin D deficiency, unspecified: Secondary | ICD-10-CM | POA: Diagnosis not present

## 2019-05-09 DIAGNOSIS — Z0000 Encounter for general adult medical examination without abnormal findings: Secondary | ICD-10-CM | POA: Diagnosis not present

## 2019-05-09 DIAGNOSIS — Z6841 Body Mass Index (BMI) 40.0 and over, adult: Secondary | ICD-10-CM | POA: Diagnosis not present

## 2019-05-24 MED FILL — valACYclovir HCL 1 GM TABS: 1 | 30 days supply | Qty: 30 | Fill #0

## 2019-07-31 DIAGNOSIS — Z01419 Encounter for gynecological examination (general) (routine) without abnormal findings: Secondary | ICD-10-CM | POA: Diagnosis not present

## 2019-07-31 DIAGNOSIS — Z1231 Encounter for screening mammogram for malignant neoplasm of breast: Secondary | ICD-10-CM | POA: Diagnosis not present

## 2020-03-25 ENCOUNTER — Other Ambulatory Visit (HOSPITAL_COMMUNITY): Payer: Self-pay | Admitting: Internal Medicine

## 2020-03-25 MED FILL — FLUTICASONE PROP 50 MCG SPR: 50 | 60 days supply | Qty: 16 | Fill #0

## 2020-03-25 MED FILL — valACYclovir HCL 1 GM TABS: 1 | 30 days supply | Qty: 30 | Fill #1

## 2020-04-04 MED FILL — valACYclovir HCL 1 GM TABS: 1 | 30 days supply | Qty: 30 | Fill #1

## 2020-04-04 MED FILL — FLUTICASONE PROP 50 MCG SPR: 50 | 60 days supply | Qty: 16 | Fill #0

## 2020-05-08 DIAGNOSIS — Z0000 Encounter for general adult medical examination without abnormal findings: Secondary | ICD-10-CM | POA: Diagnosis not present

## 2020-05-08 DIAGNOSIS — Z6841 Body Mass Index (BMI) 40.0 and over, adult: Secondary | ICD-10-CM | POA: Diagnosis not present

## 2020-05-08 DIAGNOSIS — E785 Hyperlipidemia, unspecified: Secondary | ICD-10-CM | POA: Diagnosis not present

## 2020-05-14 ENCOUNTER — Other Ambulatory Visit (HOSPITAL_COMMUNITY): Payer: Self-pay | Admitting: Family Medicine

## 2020-05-14 DIAGNOSIS — Z0000 Encounter for general adult medical examination without abnormal findings: Secondary | ICD-10-CM | POA: Diagnosis not present

## 2020-05-14 DIAGNOSIS — E559 Vitamin D deficiency, unspecified: Secondary | ICD-10-CM | POA: Diagnosis not present

## 2020-05-14 DIAGNOSIS — B009 Herpesviral infection, unspecified: Secondary | ICD-10-CM | POA: Diagnosis not present

## 2020-05-14 DIAGNOSIS — J309 Allergic rhinitis, unspecified: Secondary | ICD-10-CM | POA: Diagnosis not present

## 2020-05-14 MED FILL — valACYclovir HCL 1 GM TABS: 1 | 90 days supply | Qty: 90 | Fill #0

## 2020-10-16 DIAGNOSIS — Z124 Encounter for screening for malignant neoplasm of cervix: Secondary | ICD-10-CM | POA: Diagnosis not present

## 2020-10-16 DIAGNOSIS — Z01419 Encounter for gynecological examination (general) (routine) without abnormal findings: Secondary | ICD-10-CM | POA: Diagnosis not present

## 2020-10-16 DIAGNOSIS — R87810 Cervical high risk human papillomavirus (HPV) DNA test positive: Secondary | ICD-10-CM | POA: Diagnosis not present

## 2020-10-16 DIAGNOSIS — Z1231 Encounter for screening mammogram for malignant neoplasm of breast: Secondary | ICD-10-CM | POA: Diagnosis not present

## 2020-10-31 DIAGNOSIS — R87810 Cervical high risk human papillomavirus (HPV) DNA test positive: Secondary | ICD-10-CM | POA: Diagnosis not present

## 2020-11-18 MED FILL — valACYclovir HCL 1 GM TABS: 1 | 90 days supply | Qty: 90 | Fill #1

## 2020-11-18 MED FILL — FLUTICASONE PROP 50 MCG SPR: 50 | 60 days supply | Qty: 16 | Fill #1

## 2021-05-13 DIAGNOSIS — Z0000 Encounter for general adult medical examination without abnormal findings: Secondary | ICD-10-CM | POA: Diagnosis not present

## 2021-05-20 ENCOUNTER — Other Ambulatory Visit (HOSPITAL_COMMUNITY): Payer: Self-pay

## 2021-05-20 DIAGNOSIS — B009 Herpesviral infection, unspecified: Secondary | ICD-10-CM | POA: Diagnosis not present

## 2021-05-20 DIAGNOSIS — Z0000 Encounter for general adult medical examination without abnormal findings: Secondary | ICD-10-CM | POA: Diagnosis not present

## 2021-05-20 MED ORDER — VALACYCLOVIR HCL 1 G PO TABS
1000.0000 mg | ORAL_TABLET | Freq: Every day | ORAL | 1 refills | Status: AC | PRN
  Filled 2021-05-20: qty 90, 90d supply, fill #0
  Filled 2021-09-17: qty 90, 90d supply, fill #1

## 2021-05-22 ENCOUNTER — Other Ambulatory Visit (HOSPITAL_COMMUNITY): Payer: Self-pay

## 2021-06-03 DIAGNOSIS — R87810 Cervical high risk human papillomavirus (HPV) DNA test positive: Secondary | ICD-10-CM | POA: Diagnosis not present

## 2021-06-30 DIAGNOSIS — H524 Presbyopia: Secondary | ICD-10-CM | POA: Diagnosis not present

## 2021-08-06 ENCOUNTER — Other Ambulatory Visit (HOSPITAL_COMMUNITY): Payer: Self-pay

## 2021-09-17 ENCOUNTER — Other Ambulatory Visit (HOSPITAL_COMMUNITY): Payer: Self-pay

## 2022-02-18 DIAGNOSIS — Z124 Encounter for screening for malignant neoplasm of cervix: Secondary | ICD-10-CM | POA: Diagnosis not present

## 2022-02-18 DIAGNOSIS — Z1231 Encounter for screening mammogram for malignant neoplasm of breast: Secondary | ICD-10-CM | POA: Diagnosis not present

## 2022-02-18 DIAGNOSIS — Z01419 Encounter for gynecological examination (general) (routine) without abnormal findings: Secondary | ICD-10-CM | POA: Diagnosis not present

## 2022-02-27 DIAGNOSIS — Z1211 Encounter for screening for malignant neoplasm of colon: Secondary | ICD-10-CM | POA: Diagnosis not present

## 2022-05-24 ENCOUNTER — Other Ambulatory Visit (HOSPITAL_COMMUNITY): Payer: Self-pay

## 2022-05-24 DIAGNOSIS — Z803 Family history of malignant neoplasm of breast: Secondary | ICD-10-CM | POA: Diagnosis not present

## 2022-05-24 DIAGNOSIS — J309 Allergic rhinitis, unspecified: Secondary | ICD-10-CM | POA: Diagnosis not present

## 2022-05-24 DIAGNOSIS — B009 Herpesviral infection, unspecified: Secondary | ICD-10-CM | POA: Diagnosis not present

## 2022-05-24 DIAGNOSIS — Z6838 Body mass index (BMI) 38.0-38.9, adult: Secondary | ICD-10-CM | POA: Diagnosis not present

## 2022-05-24 DIAGNOSIS — E661 Drug-induced obesity: Secondary | ICD-10-CM | POA: Diagnosis not present

## 2022-05-24 DIAGNOSIS — Z0000 Encounter for general adult medical examination without abnormal findings: Secondary | ICD-10-CM | POA: Diagnosis not present

## 2022-05-24 DIAGNOSIS — E785 Hyperlipidemia, unspecified: Secondary | ICD-10-CM | POA: Diagnosis not present

## 2022-05-24 DIAGNOSIS — E559 Vitamin D deficiency, unspecified: Secondary | ICD-10-CM | POA: Diagnosis not present

## 2022-05-24 MED ORDER — VALACYCLOVIR HCL 1 G PO TABS
1000.0000 mg | ORAL_TABLET | Freq: Every day | ORAL | 1 refills | Status: DC | PRN
  Filled 2022-05-24: qty 90, 90d supply, fill #0
  Filled 2022-08-27 – 2023-04-29 (×2): qty 90, 90d supply, fill #1

## 2022-08-23 ENCOUNTER — Ambulatory Visit (HOSPITAL_COMMUNITY): Payer: 59

## 2022-08-23 ENCOUNTER — Other Ambulatory Visit (HOSPITAL_COMMUNITY): Payer: Self-pay

## 2022-08-23 ENCOUNTER — Ambulatory Visit: Payer: Self-pay

## 2022-08-23 DIAGNOSIS — T671XXA Heat syncope, initial encounter: Secondary | ICD-10-CM | POA: Diagnosis not present

## 2022-08-23 DIAGNOSIS — S069X9A Unspecified intracranial injury with loss of consciousness of unspecified duration, initial encounter: Secondary | ICD-10-CM | POA: Diagnosis not present

## 2022-08-23 DIAGNOSIS — J0100 Acute maxillary sinusitis, unspecified: Secondary | ICD-10-CM | POA: Diagnosis not present

## 2022-08-23 MED ORDER — AMOXICILLIN-POT CLAVULANATE 500-125 MG PO TABS
1.0000 | ORAL_TABLET | Freq: Two times a day (BID) | ORAL | 0 refills | Status: DC
  Filled 2022-08-23: qty 20, 10d supply, fill #0

## 2022-08-24 ENCOUNTER — Other Ambulatory Visit: Payer: Self-pay | Admitting: Registered Nurse

## 2022-08-24 ENCOUNTER — Ambulatory Visit
Admission: RE | Admit: 2022-08-24 | Discharge: 2022-08-24 | Disposition: A | Discharge status: Home / Self Care | Payer: 59 | Source: Ambulatory Visit | Attending: Registered Nurse | Admitting: Registered Nurse

## 2022-08-24 DIAGNOSIS — J0100 Acute maxillary sinusitis, unspecified: Secondary | ICD-10-CM

## 2022-08-24 DIAGNOSIS — R519 Headache, unspecified: Secondary | ICD-10-CM | POA: Diagnosis not present

## 2022-08-24 DIAGNOSIS — R55 Syncope and collapse: Secondary | ICD-10-CM | POA: Diagnosis not present

## 2022-08-24 DIAGNOSIS — S0231XA Fracture of orbital floor, right side, initial encounter for closed fracture: Secondary | ICD-10-CM | POA: Diagnosis not present

## 2022-08-25 ENCOUNTER — Other Ambulatory Visit: Payer: 59

## 2022-08-25 ENCOUNTER — Other Ambulatory Visit (HOSPITAL_COMMUNITY): Payer: Self-pay

## 2022-08-25 DIAGNOSIS — D2272 Melanocytic nevi of left lower limb, including hip: Secondary | ICD-10-CM | POA: Diagnosis not present

## 2022-08-25 DIAGNOSIS — D225 Melanocytic nevi of trunk: Secondary | ICD-10-CM | POA: Diagnosis not present

## 2022-08-25 DIAGNOSIS — L821 Other seborrheic keratosis: Secondary | ICD-10-CM | POA: Diagnosis not present

## 2022-08-25 DIAGNOSIS — L814 Other melanin hyperpigmentation: Secondary | ICD-10-CM | POA: Diagnosis not present

## 2022-08-25 DIAGNOSIS — D2271 Melanocytic nevi of right lower limb, including hip: Secondary | ICD-10-CM | POA: Diagnosis not present

## 2022-08-25 DIAGNOSIS — L2089 Other atopic dermatitis: Secondary | ICD-10-CM | POA: Diagnosis not present

## 2022-08-25 DIAGNOSIS — D2262 Melanocytic nevi of left upper limb, including shoulder: Secondary | ICD-10-CM | POA: Diagnosis not present

## 2022-08-25 DIAGNOSIS — L7211 Pilar cyst: Secondary | ICD-10-CM | POA: Diagnosis not present

## 2022-08-25 DIAGNOSIS — D2261 Melanocytic nevi of right upper limb, including shoulder: Secondary | ICD-10-CM | POA: Diagnosis not present

## 2022-08-25 MED ORDER — OPZELURA 1.5 % EX CREA
TOPICAL_CREAM | Freq: Two times a day (BID) | CUTANEOUS | 3 refills | Status: AC
  Filled 2022-08-25 – 2023-05-03 (×3): qty 60, 30d supply, fill #0
  Filled 2023-06-29: qty 60, 30d supply, fill #1

## 2022-08-25 MED ORDER — DESONIDE 0.05 % EX OINT
TOPICAL_OINTMENT | Freq: Two times a day (BID) | CUTANEOUS | 1 refills | Status: AC
  Filled 2022-08-25: qty 60, 30d supply, fill #0
  Filled 2023-04-29 – 2023-06-29 (×2): qty 60, 30d supply, fill #1

## 2022-08-26 ENCOUNTER — Other Ambulatory Visit (HOSPITAL_COMMUNITY): Payer: Self-pay

## 2022-08-26 DIAGNOSIS — S0231XA Fracture of orbital floor, right side, initial encounter for closed fracture: Secondary | ICD-10-CM | POA: Diagnosis not present

## 2022-08-27 ENCOUNTER — Other Ambulatory Visit (HOSPITAL_COMMUNITY): Payer: Self-pay

## 2022-08-27 MED ORDER — AZITHROMYCIN 250 MG PO TABS
ORAL_TABLET | ORAL | 0 refills | Status: DC
  Filled 2022-08-27: qty 6, 5d supply, fill #0

## 2022-09-06 ENCOUNTER — Other Ambulatory Visit (HOSPITAL_COMMUNITY): Payer: Self-pay

## 2022-09-09 DIAGNOSIS — S0231XD Fracture of orbital floor, right side, subsequent encounter for fracture with routine healing: Secondary | ICD-10-CM | POA: Diagnosis not present

## 2022-10-13 DIAGNOSIS — S0231XD Fracture of orbital floor, right side, subsequent encounter for fracture with routine healing: Secondary | ICD-10-CM | POA: Diagnosis not present

## 2023-04-29 ENCOUNTER — Other Ambulatory Visit (HOSPITAL_COMMUNITY): Payer: Self-pay

## 2023-05-03 ENCOUNTER — Other Ambulatory Visit (HOSPITAL_COMMUNITY): Payer: Self-pay

## 2023-05-04 ENCOUNTER — Other Ambulatory Visit: Payer: Self-pay

## 2023-05-04 ENCOUNTER — Other Ambulatory Visit (HOSPITAL_COMMUNITY): Payer: Self-pay

## 2023-05-15 ENCOUNTER — Encounter: Payer: Self-pay | Admitting: Emergency Medicine

## 2023-05-15 ENCOUNTER — Ambulatory Visit (INDEPENDENT_AMBULATORY_CARE_PROVIDER_SITE_OTHER): Payer: Commercial Managed Care - PPO

## 2023-05-15 ENCOUNTER — Other Ambulatory Visit: Payer: Self-pay

## 2023-05-15 ENCOUNTER — Ambulatory Visit
Admission: EM | Admit: 2023-05-15 | Discharge: 2023-05-15 | Disposition: A | Discharge status: Home / Self Care | Payer: Commercial Managed Care - PPO | Attending: Family Medicine | Admitting: Family Medicine

## 2023-05-15 DIAGNOSIS — R509 Fever, unspecified: Secondary | ICD-10-CM | POA: Diagnosis not present

## 2023-05-15 DIAGNOSIS — R059 Cough, unspecified: Secondary | ICD-10-CM

## 2023-05-15 DIAGNOSIS — J189 Pneumonia, unspecified organism: Secondary | ICD-10-CM | POA: Diagnosis not present

## 2023-05-15 DIAGNOSIS — R52 Pain, unspecified: Secondary | ICD-10-CM

## 2023-05-15 LAB — POC SARS CORONAVIRUS 2 AG -  ED: SARS Coronavirus 2 Ag: NEGATIVE

## 2023-05-15 MED ORDER — BENZONATATE 200 MG PO CAPS: 200.0000 mg | ORAL_CAPSULE | Freq: Three times a day (TID) | ORAL | 0 refills | Status: AC | PRN

## 2023-05-15 MED ORDER — AZITHROMYCIN 250 MG PO TABS: 250.0000 mg | ORAL_TABLET | Freq: Every day | ORAL | 0 refills | Status: AC

## 2023-05-15 MED ORDER — HYDROCODONE BIT-HOMATROP MBR 5-1.5 MG/5ML PO SOLN: 5.0000 mL | Freq: Four times a day (QID) | ORAL | 0 refills | Status: AC | PRN

## 2023-05-15 MED ORDER — AMOXICILLIN-POT CLAVULANATE 875-125 MG PO TABS: 1.0000 | ORAL_TABLET | Freq: Two times a day (BID) | ORAL | 0 refills | Status: AC

## 2023-05-15 NOTE — ED Triage Notes (Signed)
Patient c/o body aches, cough, some wheezing x 4 days.  Patient denies any other cold sx's.  Patient has been taken Advil to help with pain.

## 2023-05-15 NOTE — Discharge Instructions (Addendum)
Advised patient to take medications as directed with food to completion.  Advised patient to take Zithromax with first dose of Augmentin for the next 5 of 10 days.  Advised may take Tessalon Perles daily or as needed for cough.  Advised may use Hycodan cough syrup at night for cough due to sedative effects.  Advised patient not to use cough medications together.  Encouraged increase daily water intake to 64 ounces per day while taking these medications.  Advised patient may take OTC Tylenol 1000 mg every 6 hours for fever (oral temperature greater than 100.3).  Advised if symptoms worsen and/or unresolved please follow-up with PCP or here for further evaluation.

## 2023-05-15 NOTE — ED Provider Notes (Signed)
[facility]    CSN: [id number] Arrival date & time: 05/15/23  1112      History   Chief Complaint Chief Complaint  Patient presents with   Generalized Body Aches    HPI Morgan Hansen is a 47 y.o. female.   HPI 47 year old female presents with bodyaches, cough, some wheezing for 4 days.  PMH significant for obesity, T2DM, and acute nonrecurrent maxillary sinusitis.  Past Medical History:  Diagnosis Date   Diabetes mellitus    GDM diet controlled   Obesity    PONV (postoperative nausea and vomiting)     There are no problems to display for this patient.   Past Surgical History:  Procedure Laterality Date   CESAREAN SECTION     CESAREAN SECTION  09/19/2012   Procedure: CESAREAN SECTION;  Surgeon: Loney Laurence, MD;  Location: [facility];  Service: Obstetrics;  Laterality: N/A;   TONSILLECTOMY     TUBAL LIGATION  09/19/2012   Procedure: BILATERAL TUBAL LIGATION;  Surgeon: Loney Laurence, MD;  Location: [facility];  Service: Obstetrics;  Laterality: Bilateral;   TYMPANOSTOMY TUBE PLACEMENT      OB History     Gravida  1   Para  1   Term  1   Preterm      AB      Living  3      SAB      IAB      Ectopic      Multiple  1   Live Births  1            Home Medications    Prior to Admission medications   Medication Sig Start Date End Date Taking? Authorizing Provider  amoxicillin-clavulanate (AUGMENTIN) 875-125 MG tablet Take 1 tablet by mouth 2 (two) times daily for 10 days. 05/15/23 05/25/23 Yes Trevor Iha, FNP  azithromycin (ZITHROMAX) 250 MG tablet Take 1 tablet (250 mg total) by mouth daily. Take first 2 tablets together, then 1 every day until finished. 05/15/23  Yes Trevor Iha, FNP  benzonatate (TESSALON) 200 MG capsule Take 1 capsule (200 mg total) by mouth 3 (three) times daily as needed for up to 7 days. 05/15/23 05/22/23 Yes Trevor Iha, FNP  HYDROcodone bit-homatropine (HYCODAN) 5-1.5 MG/5ML syrup Take 5 mLs by  mouth every 6 (six) hours as needed for cough. 05/15/23  Yes Trevor Iha, FNP  desonide (DESOWEN) 0.05 % ointment Apply a small amount topically to the affected area 2 (two) times daily. 08/25/22     fluticasone (FLONASE) 50 MCG/ACT nasal spray USE 1 SPRAY IN EACH NOSTRIL ONCE DAILY 03/25/20 03/25/21  Merri Brunette, MD  omeprazole (PRILOSEC OTC) 20 MG tablet Take 20 mg by mouth daily as needed.    [provider]  Prenatal Vit-Fe Fumarate-FA (PRENATAL MULTIVITAMIN) TABS Take 1 tablet by mouth daily.    [provider]  Ruxolitinib Phosphate (OPZELURA) 1.5 % CREA Apply a small amount topically to affected area 2 (two) times daily. 08/25/22     valACYclovir (VALTREX) 1000 MG tablet Take 1 tablet (1,000 mg total) by mouth daily as needed. 05/20/21     valACYclovir (VALTREX) 1000 MG tablet Take 1 tablet (1,000 mg total) by mouth daily as needed. 05/24/22       Family History Family History  Problem Relation Age of Onset   Asthma Other    Cancer Other     Social History Social History   Tobacco Use   Smoking status: Never  Vaping Use   Vaping Use: Never used  Substance Use Topics   Alcohol use: No   Drug use: No     Allergies   Shellfish allergy   Review of Systems Review of Systems   Physical Exam Triage Vital Signs ED Triage Vitals  Enc Vitals Group     BP 05/15/23 1126 123/83     Pulse Rate 05/15/23 1126 (!) 115     Resp 05/15/23 1126 18     Temp 05/15/23 1126 99.9 F (37.7 C)     Temp Source 05/15/23 1126 Oral     SpO2 05/15/23 1126 93 %     Weight 05/15/23 1128 220 lb (99.8 kg)     Height 05/15/23 1128 5' 3.5" (1.613 m)     Head Circumference --      Peak Flow --      Pain Score 05/15/23 1127 3     Pain Loc --      Pain Edu? --      Excl. in GC? --    No data found.  Updated Vital Signs BP 123/83 (BP Location: Right Arm)   Pulse (!) 115   Temp 99.9 F (37.7 C) (Oral)   Resp 18   Ht 5' 3.5" (1.613 m)   Wt 220 lb (99.8 kg)   SpO2 93%    BMI 38.36 kg/m     Physical Exam Vitals reviewed.  Constitutional:      Appearance: Normal appearance. She is obese. She is ill-appearing.  HENT:     Head: Normocephalic and atraumatic.     Right Ear: Tympanic membrane, ear canal and external ear normal.     Left Ear: Tympanic membrane, ear canal and external ear normal.     Mouth/Throat:     Mouth: Mucous membranes are moist.     Pharynx: Oropharynx is clear.  Eyes:     Extraocular Movements: Extraocular movements intact.     Conjunctiva/sclera: Conjunctivae normal.     Pupils: Pupils are equal, round, and reactive to light.  Cardiovascular:     Rate and Rhythm: Normal rate and regular rhythm.     Pulses: Normal pulses.     Heart sounds: Normal heart sounds.  Pulmonary:     Effort: Pulmonary effort is normal.     Breath sounds: Normal breath sounds. No wheezing, rhonchi or rales.     Comments: Frequent nonproductive cough noted on exam Musculoskeletal:        General: Normal range of motion.     Cervical back: Normal range of motion and neck supple.  Skin:    General: Skin is warm and dry.  Neurological:     General: No focal deficit present.     Mental Status: She is alert and oriented to person, place, and time. Mental status is at baseline.  Psychiatric:        Mood and Affect: Mood normal.        Behavior: Behavior normal.      UC Treatments / Results  Labs (all labs ordered are listed, but only abnormal results are displayed) Labs Reviewed  POC SARS CORONAVIRUS 2 AG -  ED    EKG   Radiology DG Chest 2 View  Result Date: 05/15/2023 CLINICAL DATA:  Cough and congestion. Fever. Diminished breath sounds. EXAM: CHEST - 2 VIEW COMPARISON:  None Available. FINDINGS: The heart size is normal. Right upper lobe pneumonia present. The lungs are otherwise clear. Lung volumes are low. The visualized soft  tissues and bony thorax are unremarkable. IMPRESSION: Right upper lobe pneumonia. Electronically Signed   By:  Marin Roberts M.D.   On: 05/15/2023 12:55    Procedures Procedures (including critical care time)  Medications Ordered in UC Medications - No data to display  Initial Impression / Assessment and Plan / UC Course  I have reviewed the triage vital signs and the nursing notes.  Pertinent labs & imaging results that were available during my care of the patient were reviewed by me and considered in my medical decision making (see chart for details).     MDM: 1.  Community-acquired pneumonia of right upper lobe of lung-CXR revealed above Rx'd Augmentin 875/125 mg tablet twice daily x 10 days, Zithromax (500 mg day 1, then 250 mg daily x 4 days); 2.  Cough-Rx'd Tessalon Perles 200 mg 3 times daily, as needed, Rx'd Hycodan 5-1.5 mg / 5 mL syrup: Take 5 mL every 6 hours, as needed for cough; 3.  Fever-advised patient may take 1 g of Tylenol every 6 hours for fever (oral temperature greater than 100.3); 4.  Body aches-POCT COVID-19 negative. Advised patient to take medications as directed with food to completion.  Advised patient to take Zithromax with first dose of Augmentin for the next 5 of 10 days.  Advised may take Tessalon Perles daily or as needed for cough.  Advised may use Hycodan cough syrup at night for cough due to sedative effects.  Advised patient not to use cough medications together.  Encouraged increase daily water intake to 64 ounces per day while taking these medications.  Advised patient may take OTC Tylenol 1000 mg every 6 hours for fever (oral temperature greater than 100.3).  Advised if symptoms worsen and/or unresolved please follow-up with PCP or here for further evaluation.  Work note provided to patient prior to discharge.  Patient discharged home, hemodynamically stable. Final Clinical Impressions(s) / UC Diagnoses   Final diagnoses:  Body aches  Fever, unspecified  Cough, unspecified type  Community acquired pneumonia of right upper lobe of lung     Discharge  Instructions      Advised patient to take medications as directed with food to completion.  Advised patient to take Zithromax with first dose of Augmentin for the next 5 of 10 days.  Advised may take Tessalon Perles daily or as needed for cough.  Advised may use Hycodan cough syrup at night for cough due to sedative effects.  Advised patient not to use cough medications together.  Encouraged increase daily water intake to 64 ounces per day while taking these medications.  Advised patient may take OTC Tylenol 1000 mg every 6 hours for fever (oral temperature greater than 100.3).  Advised if symptoms worsen and/or unresolved please follow-up with PCP or here for further evaluation.     ED Prescriptions     Medication Sig Dispense Auth. Provider   azithromycin (ZITHROMAX) 250 MG tablet Take 1 tablet (250 mg total) by mouth daily. Take first 2 tablets together, then 1 every day until finished. 6 tablet Trevor Iha, FNP   amoxicillin-clavulanate (AUGMENTIN) 875-125 MG tablet Take 1 tablet by mouth 2 (two) times daily for 10 days. 20 tablet Trevor Iha, FNP   benzonatate (TESSALON) 200 MG capsule Take 1 capsule (200 mg total) by mouth 3 (three) times daily as needed for up to 7 days. 40 capsule Trevor Iha, FNP   HYDROcodone bit-homatropine (HYCODAN) 5-1.5 MG/5ML syrup Take 5 mLs by mouth every 6 (six) hours as  needed for cough. 120 mL Trevor Iha, FNP      I have reviewed the PDMP during this encounter.   Trevor Iha, FNP 05/15/23 1325

## 2023-06-13 DIAGNOSIS — E559 Vitamin D deficiency, unspecified: Secondary | ICD-10-CM | POA: Diagnosis not present

## 2023-06-13 DIAGNOSIS — E785 Hyperlipidemia, unspecified: Secondary | ICD-10-CM | POA: Diagnosis not present

## 2023-06-13 DIAGNOSIS — Z0000 Encounter for general adult medical examination without abnormal findings: Secondary | ICD-10-CM | POA: Diagnosis not present

## 2023-06-20 ENCOUNTER — Other Ambulatory Visit (HOSPITAL_COMMUNITY): Payer: Self-pay

## 2023-06-20 DIAGNOSIS — Z0000 Encounter for general adult medical examination without abnormal findings: Secondary | ICD-10-CM | POA: Diagnosis not present

## 2023-06-20 DIAGNOSIS — J189 Pneumonia, unspecified organism: Secondary | ICD-10-CM | POA: Diagnosis not present

## 2023-06-20 DIAGNOSIS — E559 Vitamin D deficiency, unspecified: Secondary | ICD-10-CM | POA: Diagnosis not present

## 2023-06-20 MED ORDER — VALACYCLOVIR HCL 1 G PO TABS
1.0000 g | ORAL_TABLET | ORAL | 0 refills | Status: AC | PRN
  Filled 2023-06-20 – 2023-07-06 (×2): qty 30, 30d supply, fill #0
  Filled 2023-07-12: qty 30, fill #0
  Filled 2024-05-08: qty 30, 30d supply, fill #0

## 2023-06-29 ENCOUNTER — Other Ambulatory Visit: Payer: Self-pay

## 2023-07-01 ENCOUNTER — Other Ambulatory Visit (HOSPITAL_COMMUNITY): Payer: Self-pay

## 2023-07-06 ENCOUNTER — Other Ambulatory Visit (HOSPITAL_COMMUNITY): Payer: Self-pay

## 2023-07-06 DIAGNOSIS — Z124 Encounter for screening for malignant neoplasm of cervix: Secondary | ICD-10-CM | POA: Diagnosis not present

## 2023-07-06 DIAGNOSIS — Z1231 Encounter for screening mammogram for malignant neoplasm of breast: Secondary | ICD-10-CM | POA: Diagnosis not present

## 2023-07-06 DIAGNOSIS — Z01419 Encounter for gynecological examination (general) (routine) without abnormal findings: Secondary | ICD-10-CM | POA: Diagnosis not present

## 2023-07-08 ENCOUNTER — Other Ambulatory Visit: Payer: Self-pay | Admitting: Oncology

## 2023-07-08 DIAGNOSIS — Z006 Encounter for examination for normal comparison and control in clinical research program: Secondary | ICD-10-CM

## 2023-07-12 ENCOUNTER — Other Ambulatory Visit: Payer: Self-pay

## 2023-09-12 DIAGNOSIS — T17908A Unspecified foreign body in respiratory tract, part unspecified causing other injury, initial encounter: Secondary | ICD-10-CM | POA: Diagnosis not present

## 2024-05-08 ENCOUNTER — Other Ambulatory Visit (HOSPITAL_COMMUNITY): Payer: Self-pay

## 2024-06-14 DIAGNOSIS — E559 Vitamin D deficiency, unspecified: Secondary | ICD-10-CM | POA: Diagnosis not present

## 2024-06-14 DIAGNOSIS — E785 Hyperlipidemia, unspecified: Secondary | ICD-10-CM | POA: Diagnosis not present

## 2024-06-14 DIAGNOSIS — Z0000 Encounter for general adult medical examination without abnormal findings: Secondary | ICD-10-CM | POA: Diagnosis not present

## 2024-06-21 ENCOUNTER — Other Ambulatory Visit (HOSPITAL_COMMUNITY): Payer: Self-pay

## 2024-06-21 DIAGNOSIS — B009 Herpesviral infection, unspecified: Secondary | ICD-10-CM | POA: Diagnosis not present

## 2024-06-21 DIAGNOSIS — Z0000 Encounter for general adult medical examination without abnormal findings: Secondary | ICD-10-CM | POA: Diagnosis not present

## 2024-06-21 MED ORDER — VALACYCLOVIR HCL 1 G PO TABS
1.0000 g | ORAL_TABLET | Freq: Every day | ORAL | 1 refills | Status: AC | PRN
  Filled 2024-06-21: qty 30, 30d supply, fill #0

## 2024-08-29 ENCOUNTER — Other Ambulatory Visit (HOSPITAL_COMMUNITY): Payer: Self-pay

## 2024-08-29 DIAGNOSIS — D2271 Melanocytic nevi of right lower limb, including hip: Secondary | ICD-10-CM | POA: Diagnosis not present

## 2024-08-29 DIAGNOSIS — L814 Other melanin hyperpigmentation: Secondary | ICD-10-CM | POA: Diagnosis not present

## 2024-08-29 DIAGNOSIS — D225 Melanocytic nevi of trunk: Secondary | ICD-10-CM | POA: Diagnosis not present

## 2024-08-29 DIAGNOSIS — D224 Melanocytic nevi of scalp and neck: Secondary | ICD-10-CM | POA: Diagnosis not present

## 2024-08-29 DIAGNOSIS — Z1231 Encounter for screening mammogram for malignant neoplasm of breast: Secondary | ICD-10-CM | POA: Diagnosis not present

## 2024-08-29 DIAGNOSIS — L2089 Other atopic dermatitis: Secondary | ICD-10-CM | POA: Diagnosis not present

## 2024-08-29 DIAGNOSIS — D1801 Hemangioma of skin and subcutaneous tissue: Secondary | ICD-10-CM | POA: Diagnosis not present

## 2024-08-29 DIAGNOSIS — Z01419 Encounter for gynecological examination (general) (routine) without abnormal findings: Secondary | ICD-10-CM | POA: Diagnosis not present

## 2024-08-29 DIAGNOSIS — D2272 Melanocytic nevi of left lower limb, including hip: Secondary | ICD-10-CM | POA: Diagnosis not present

## 2024-08-29 DIAGNOSIS — L821 Other seborrheic keratosis: Secondary | ICD-10-CM | POA: Diagnosis not present

## 2024-08-29 MED ORDER — OPZELURA 1.5 % EX CREA
1.0000 | TOPICAL_CREAM | Freq: Two times a day (BID) | CUTANEOUS | 3 refills | Status: AC
  Filled 2024-08-29 – 2024-09-03 (×2): qty 60, 30d supply, fill #0

## 2024-08-29 MED ORDER — DESONIDE 0.05 % EX OINT
1.0000 | TOPICAL_OINTMENT | Freq: Two times a day (BID) | CUTANEOUS | 3 refills | Status: AC
  Filled 2024-08-29: qty 60, 30d supply, fill #0

## 2024-09-03 ENCOUNTER — Other Ambulatory Visit (HOSPITAL_COMMUNITY): Payer: Self-pay

## 2024-09-03 ENCOUNTER — Other Ambulatory Visit: Payer: Self-pay

## 2024-09-05 ENCOUNTER — Other Ambulatory Visit (HOSPITAL_COMMUNITY): Payer: Self-pay

## 2024-09-18 ENCOUNTER — Other Ambulatory Visit: Payer: Self-pay | Admitting: Medical Genetics

## 2024-09-18 DIAGNOSIS — Z006 Encounter for examination for normal comparison and control in clinical research program: Secondary | ICD-10-CM
